# Patient Record
Sex: Female | Born: 1935 | Race: Black or African American | Hispanic: No | Marital: Single | Smoking: Never smoker
Health system: Southern US, Community
[De-identification: ages and names within clinical notes are randomized; demographics above are authoritative.]

## PROBLEM LIST (undated history)

## (undated) DIAGNOSIS — H269 Unspecified cataract: Secondary | ICD-10-CM

## (undated) DIAGNOSIS — I639 Cerebral infarction, unspecified: Secondary | ICD-10-CM

## (undated) DIAGNOSIS — N289 Disorder of kidney and ureter, unspecified: Secondary | ICD-10-CM

## (undated) DIAGNOSIS — I10 Essential (primary) hypertension: Secondary | ICD-10-CM

## (undated) DIAGNOSIS — R002 Palpitations: Secondary | ICD-10-CM

## (undated) HISTORY — PX: ABDOMINAL HYSTERECTOMY: SHX81

## (undated) HISTORY — DX: Disorder of kidney and ureter, unspecified: N28.9

## (undated) HISTORY — DX: Cerebral infarction, unspecified: I63.9

## (undated) HISTORY — DX: Palpitations: R00.2

## (undated) HISTORY — PX: TOTAL KNEE ARTHROPLASTY: SHX125

## (undated) HISTORY — DX: Essential (primary) hypertension: I10

---

## 2003-03-20 ENCOUNTER — Encounter: Admission: RE | Admit: 2003-03-20 | Discharge: 2003-03-20 | Payer: Self-pay | Admitting: Family Medicine

## 2003-03-20 ENCOUNTER — Encounter: Payer: Self-pay | Admitting: Family Medicine

## 2003-04-03 ENCOUNTER — Encounter: Payer: Self-pay | Admitting: Family Medicine

## 2003-04-03 ENCOUNTER — Encounter: Admission: RE | Admit: 2003-04-03 | Discharge: 2003-04-03 | Payer: Self-pay | Admitting: Family Medicine

## 2006-05-11 ENCOUNTER — Encounter: Admission: RE | Admit: 2006-05-11 | Discharge: 2006-05-11 | Payer: Self-pay | Admitting: Orthopedic Surgery

## 2007-04-16 ENCOUNTER — Encounter: Admission: RE | Admit: 2007-04-16 | Discharge: 2007-04-16 | Payer: Self-pay | Admitting: Orthopedic Surgery

## 2007-10-06 ENCOUNTER — Inpatient Hospital Stay (HOSPITAL_COMMUNITY): Admission: RE | Admit: 2007-10-06 | Discharge: 2007-10-11 | Payer: Self-pay | Admitting: Orthopedic Surgery

## 2010-10-22 NOTE — H&P (Signed)
[name], [name]               ACCOUNT NO.:  [id number]   MEDICAL RECORD NO.:  [id number]          PATIENT TYPE:  INP   LOCATION:  0003                         FACILITY:  [facility]   PHYSICIAN:  Patricia [name], M.D.    DATE OF BIRTH:  18-Aug-1918   DATE OF ADMISSION:  10/06/2007  DATE OF DISCHARGE:                              HISTORY & PHYSICAL   Date of office visit, history and physical performed on 08/26/2007.   CHIEF COMPLAINT:  Right knee pain.   HISTORY OF PRESENT ILLNESS:  The patient is an 77 year old female who  has been seen by Dr. Lequita [name] for ongoing right knee pain.  It has been  progressively getting worse.  She has gone undergone conservative  treatment of past but has end-stage arthritis now bone-on-bone.  It was  felt she would benefit from undergoing surgical intervention.  Risks,  benefits discussed.  She elects to proceed with surgery.  She has been  seen preoperatively by Dr. Clovis [name] and felt to be good for surgery.   ALLERGIES:  NO KNOWN DRUG ALLERGIES.   CURRENT MEDICATIONS:  Aspirin, Naprelan, amlodipine/benazepril.   PAST MEDICAL HISTORY:  Hypertension.   PAST SURGICAL HISTORY:  Hysterectomy, (please note she did have some  keloid scarring after hysterectomy).   SOCIAL HISTORY:  Single, nonsmoker.  No alcohol.  No children.  Family  will assist with care after surgery.   FAMILY HISTORY:  Heart disease, hypertension, diabetes, breast cancer,  stroke and arthritis.   REVIEW OF SYSTEMS:  GENERAL:  No fevers, chills, night sweats.  NEURO:  No seizures or paralysis.  RESPIRATORY: No shortness of breath,  productive cough or hemoptysis.  CARDIOVASCULAR:  No chest pain.  GI: No  nausea, vomiting, diarrhea or constipation.  GU: No dysuria or hematuria  or discharge.  MUSCULOSKELETAL:  Right knee.   PHYSICAL EXAMINATION:  VITAL SIGNS:  Pulse 64, respirations 14, blood  pressure 150/70.  GENERAL:  An 77 year old African American female well-nourished,  well-  developed, no acute distress.  She is alert and cooperative, accompanied  by her friends and family.  HEENT: Normocephalic, atraumatic.  Pupils  are reactive.  EOMs intact.  Upper partial plate.  Noted to wear  glasses.  NECK:  Supple.  No bruits.  CHEST: Clear anterior posterior chest walls.  No rhonchi, rales or  wheezes.  HEART:  Regular rate and rhythm.  No murmur.  ABDOMEN: Soft, nontender.  Bowel sounds present.  RECTUM/GENITALIA: Not done .  EXTREMITIES:  Right knee:  Significant valgus malalignment deformity.  No effusion.  Range of motion 5-150, marked crepitus, no instability.   IMPRESSION:  1. Osteoarthritis right knee.  2. Hypertension.   PLAN:  The patient admitted to [facility] to undergo a right  total knee replacement arthroplasty.  Surgery will be performed by Dr.  Ollen [name].      Patricia [name], P.A.C.      Patricia [name], M.D.  Electronically Signed    ALP/MEDQ  D:  10/06/2007  T:  10/06/2007  Job:  [id number]   cc:   Patricia [name], M.D.  Fax: [phone]   Patricia [name], M.D.  Fax: [phone]

## 2010-10-22 NOTE — Op Note (Signed)
NAMEDANELLY, HASSINGER               ACCOUNT NO.:  [id number]   MEDICAL RECORD NO.:  [id number]          PATIENT TYPE:  INP   LOCATION:  0003                         FACILITY:  [facility]   PHYSICIAN:  Ollen Gross, M.D.    DATE OF BIRTH:  [birth year]   DATE OF PROCEDURE:  10/06/2007  DATE OF DISCHARGE:                               OPERATIVE REPORT   PREOPERATIVE DIAGNOSIS:  Osteoarthritis right knee.   POSTOPERATIVE DIAGNOSIS:  Osteoarthritis right knee.   PROCEDURE:  Right total knee arthroplasty.   SURGEON:  Ollen Gross, M.D.   ASSISTANT:  Alexzandrew L. Perkins, P.A.C.   ANESTHESIA:  General with postop Marcaine pain pump.   ESTIMATED BLOOD LOSS:  Minimal.   DRAINS:  None.   TOURNIQUET TIME:  38 minutes 300 mmHg.   COMPLICATIONS:  None.   CONDITION:  Stable to recovery.   INDICATIONS:  Mrs. Gendron is an 77 year old female with end-stage  valgus arthritis of the right knee with progressively worsening painful  dysfunction.  She has failed non-operative management and presents for  total knee arthroplasty.   PROCEDURE IN DETAIL:  After successful administration of general  anesthetic, a tourniquet was placed on the right thigh and right lower  extremity was  prepped and draped in the usual sterile fashion.  Extremities wrapped in Esmarch, knee flexed and tourniquet inflated to  300 mmHg.  Midline incision was made a 10-blade through the subcutaneous  tissue to the level of the extensor mechanism.  A fresh blade is used  make a lateral parapatellar arthrotomy.  I went lateral because of the  valgus deformity.  Soft tissue of the proximal lateral tibia  subperiosteally elevated to the joint line and then around to the  posterolateral corner, but not including the structures of the  posterolateral corner.  The patella was everted medially, knee flexed 90  degrees, ACL, PCL removed.  Drill was used to create a starting hole in  the distal femur and now is thoroughly  irrigated.  The 5-degree right  valgus alignment guide is placed and referencing off posterior condyles,  rotation is marked with a block pinned to remove 11 mm off the distal  femur.  I took 11 because the lateral defect required _that I take 11 mm  in order to take some bone off laterally.  Distal femoral resection is  made with an oscillating saw.  Sizing blocks placed _and size 3 is most  appropriate.  Rotation marked off the epicondylar axis.  Size 3 cutting  block placed and the anterior-posterior chamfer cuts made.   Tibia subluxed forward and distal remnants removed.  Extramedullary  tibial alignment guide is placed proximally at the medial aspect of  tibial tubercle distally along the second metatarsal axis tibial crest.  The block was pinned to remove 2 mm off the more deficient lateral side.  Tibial resection made with an oscillating saw.  Size 3 is most  appropriate tibial component and the proximal tibia is prepared to  modular drill and keel punch for a size 3.  Femoral preparation is  completed with the intercondylar cut.  Size 3 mobile bearing tibial,  trial size 3 posterior stabilized femoral  trial and a 10 mm posterior stabilized rotating platform insert trial  are placed.  With the 10, full extension is achieved with excellent  varus, valgus, anterior, and posterior balance throughout the full range  of motion.  The patella was everted and measured 23 mm.  Freehand  resection was taken 14 meters, 38 template is placed, lug holes are  drilled, trial patella is placed, and it tracks normally.  Osteophytes  removed the posterior femur trial in place.  All trials removed and the  cut bone surfaces are prepared with pulsatile lavage.  Cement mixed and  once ready for implantation, size 3 mobile bearing tibial tray, size 3  posterior stabilized femur, 38 patella is cemented into place.  Patella  was held with a clamp.  Trial 10-mm inserts placed, knee held full  extension  and all extruded cement removed.  Cement fully hardened then  copiously irrigated with saline solution.  Trials removed and FloSeal  injected on the posterior capsule.  Permanent 10 mm posterior stabilized  rotating platform insert is placed into the tibial tray.  FloSeal was  injected into the mediolateral gutters and suprapatellar area.  Moist  sponge is placed and tourniquet released for total time of 38 minutes.  It was held for  2 minutes and removed.  Minimal bleeding was  encountered.  That which was found was stopped with electrocautery.  Wound was thoroughly irrigated saline solution and the lateral  arthrotomy closed with interrupted #1 PDS, leaving open the smaller of  the superior to inferior pole of patella.  This served  as a mini  release.  The flexion against gravity was 135 to 140 degrees.  Subcu was  closed with interrupted 2-0 Vicryl and subcuticular running 4-0  Monocryl.  The catheter for Marcaine pain pump was placed, the pump  initiated.  Steri-Strips and bulky sterile dressing applied and she is  placed in a knee immobilizer, awakened and transported to recovery in  stable condition.      Ollen Gross, M.D.  Electronically Signed     FA/MEDQ  D:  10/06/2007  T:  10/06/2007  Job:  [id number]

## 2010-10-25 NOTE — Discharge Summary (Signed)
NAMEMYRIAN, BOTELLO               ACCOUNT NO.:  [id number]   MEDICAL RECORD NO.:  [id number]          PATIENT TYPE:  INP   LOCATION:  1537                         [facility]ACILITY:  [facility]   PHYSICIAN:  Ollen Gross, M.D.    DATE OF BIRTH:  06-Jan-1919   DATE OF ADMISSION:  10/06/2007  DATE OF DISCHARGE:  10/11/2007                               DISCHARGE SUMMARY   ADMISSION DIAGNOSES:  1. Osteoarthritis right knee.  2. Hypertension.   DISCHARGE DIAGNOSES:  1. Osteoarthritis right knee, status post right total knee      arthroplasty.  2. Acute blood loss anemia.  3. Status post transfusion without sequelae.  4. Mild postop hypokalemia.  5. Hypertension.   PROCEDURE:  Right total knee, October 06, 2007.  Surgeon Dr. Lequita Halt,  assistant Avel Peace PA-C.   ANESTHESIA:  General.   CONSULTS:  None.   BRIEF HISTORY:  Ms. Heidel is an 77 year old female with end-stage  valgus arthritis of the right knee, progressive worsening pain and  dysfunction, who now presents for total knee.   LABORATORY DATA:  Preop CBC showed hemoglobin of 12.0, hematocrit 37.1  white cell count 6.4, platelets 209.  Hemoglobin dropped down to 8.3.  Given 2 units of blood.  Post receiving, hemoglobin back to 10.1 then  drifted back down to 9.6.  Last H&H 8.7 and 25.3.  PT/PTT on admission  30.5 and 32 respectively.  INR 1.0.  Serial ProTime followed.  Last  PT/INR 20.9 and 1.7.  Chem panel on admission showed elevated BUN of 30,  creatinine 1.36.  Remaining Chem panel within normal limits.  Serial  BMPs are followed. Creatinine did go up to 1.72, but came back down to  normal level of 1.0.  BUN started at 30, came down to 24 last noted,  normal level of 15.  Potassium started out at 4.5, went  down to 3.6,  last noted was just a minimally low level at 3.2.  Remainder of  electrolytes remained within normal limits.  Preop UA negative.  Follow-  up UA showed trace hemoglobin, only few epithelials, 0-3 white  cells,  rare bacteria, hyaline casts noted.  She did have urine culture grow out  Enterobacter, and this grew out after she was discharged from the  hospital.  Followed up on an outpatient basis.   EKG, September 30, 2007:  Marked sinus bradycardia, left ventricle  hypertrophy.  No previous EKGs.  Confirmed by Dr. Charlton Haws.  Chest X-  Ray, September 30, 2007:  No acute process.   HOSPITAL COURSE:  The patient admitted to [facility],  tolerated procedure well, later transferred to recovery room then  orthopedic floor, started on PCA and p.o. analgesic pain control  following surgery.  Given 24-hour postop IV antibiotics.  Did have fair  amount of pain on day one, but doing pretty well overall.  Hemoglobin  was down to 8.3.  It was felt she would need blood after the big  surgery, given 2 units of blood.  Blood pressure meds were resumed.   By day 2, hemoglobin was back up  to 10.1.  She had tolerated the  transfusion well, was feeling better.  Started getting up a little bit  more from bed to chair.  Slowly progressing with therapy.  Dressing  change incision looked good.  She did want to go home with family.  She  was not ready yet.  We did note that the creatinine was a little  elevated.  Continued fluids and rechecked the BMP which creatinine came  back down.  Slowly progressing with physical therapy over the next  couple of days.   By postop day #3, creatinine improved back down to 1.4.  Hemoglobin was  stable.  Walking about 100 feet by day 5.  Continued to slowly improve.  Her renal function was still mildly elevated but was slowly improving  with time.  The renal insufficiency was correcting itself by Oct 11, 2007.  The patient is doing much better, had progressed more with PT,  incision was healing well.  Arrangements being made for home, and she  was discharged later that day.   DISCHARGE/PLAN:  1. The patient discharged home on Oct 11, 2007.  2. Discharge diagnoses  please see above.  3. Discharge meds Vicodin, Robaxin, Nu-Iron, Coumadin.   DISCHARGE DIET:  Low-sodium heart-healthy diet.   ACTIVITY:  Weightbearing as tolerated.  Total knee protocol.  Home  health PT, home health nursing.  Follow-up on Tuesday 05/12 or Thursday  05/14 with Dr. Lequita Halt.  Call for appointment.   DISPOSITION:  Home.   CONDITION ON DISCHARGE:  Slowly improving.      Alexzandrew L. Perkins, P.A.C.      Ollen Gross, M.D.  Electronically Signed    ALP/MEDQ  D:  11/24/2007  T:  11/24/2007  Job:  [id number]

## 2011-03-04 LAB — CBC
Hemoglobin: 12
MCHC: 32.3
Platelets: 145 — ABNORMAL LOW
Platelets: 209
RDW: 12.6
RDW: 12.8
WBC: 7.3

## 2011-03-04 LAB — TYPE AND SCREEN
ABO/RH(D): AB POS
Antibody Screen: NEGATIVE

## 2011-03-04 LAB — PROTIME-INR
INR: 1
INR: 1.2
Prothrombin Time: 13.5
Prothrombin Time: 15.1

## 2011-03-04 LAB — COMPREHENSIVE METABOLIC PANEL
ALT: 20
Albumin: 3.9
Alkaline Phosphatase: 72
Calcium: 9.7
Glucose, Bld: 98
Potassium: 4.5
Sodium: 140
Total Protein: 7.7

## 2011-03-04 LAB — BASIC METABOLIC PANEL
BUN: 18
Calcium: 8.2 — ABNORMAL LOW
GFR calc non Af Amer: 41 — ABNORMAL LOW
Glucose, Bld: 159 — ABNORMAL HIGH

## 2011-03-04 LAB — URINALYSIS, ROUTINE W REFLEX MICROSCOPIC
Bilirubin Urine: NEGATIVE
Ketones, ur: NEGATIVE
Nitrite: NEGATIVE
Protein, ur: NEGATIVE
Urobilinogen, UA: 0.2
pH: 7

## 2011-03-04 LAB — ABO/RH: ABO/RH(D): AB POS

## 2013-03-08 ENCOUNTER — Emergency Department (HOSPITAL_COMMUNITY)
Admission: EM | Admit: 2013-03-08 | Discharge: 2013-03-08 | Disposition: A | Discharge status: Home / Self Care | Payer: Medicare Other | Attending: Emergency Medicine | Admitting: Emergency Medicine

## 2013-03-08 ENCOUNTER — Encounter (HOSPITAL_COMMUNITY): Payer: Self-pay | Admitting: Emergency Medicine

## 2013-03-08 DIAGNOSIS — R197 Diarrhea, unspecified: Secondary | ICD-10-CM | POA: Insufficient documentation

## 2013-03-08 DIAGNOSIS — Z7982 Long term (current) use of aspirin: Secondary | ICD-10-CM | POA: Insufficient documentation

## 2013-03-08 DIAGNOSIS — K6289 Other specified diseases of anus and rectum: Secondary | ICD-10-CM

## 2013-03-08 DIAGNOSIS — K644 Residual hemorrhoidal skin tags: Secondary | ICD-10-CM | POA: Insufficient documentation

## 2013-03-08 DIAGNOSIS — Z79899 Other long term (current) drug therapy: Secondary | ICD-10-CM | POA: Insufficient documentation

## 2013-03-08 LAB — BASIC METABOLIC PANEL
BUN: 25 mg/dL — ABNORMAL HIGH (ref 6–23)
CO2: 22 mEq/L (ref 19–32)
Calcium: 9.3 mg/dL (ref 8.4–10.5)
Chloride: 105 mEq/L (ref 96–112)
Creatinine, Ser: 1.31 mg/dL — ABNORMAL HIGH (ref 0.50–1.10)
GFR calc Af Amer: 40 mL/min — ABNORMAL LOW (ref >90)
GFR calc non Af Amer: 34 mL/min — ABNORMAL LOW (ref >90)
Glucose, Bld: 88 mg/dL (ref 70–99)
Potassium: 4 mEq/L (ref 3.5–5.1)
Sodium: 139 mEq/L (ref 135–145)

## 2013-03-08 LAB — CBC
HCT: 33 % — ABNORMAL LOW (ref 36.0–46.0)
Hemoglobin: 11 g/dL — ABNORMAL LOW (ref 12.0–15.0)
MCH: 31.7 pg (ref 26.0–34.0)
MCHC: 33.3 g/dL (ref 30.0–36.0)
MCV: 95.1 fL (ref 78.0–100.0)
Platelets: 189 10*3/uL (ref 150–400)
RBC: 3.47 MIL/uL — ABNORMAL LOW (ref 3.87–5.11)
RDW: 13 % (ref 11.5–15.5)
WBC: 8.6 10*3/uL (ref 4.0–10.5)

## 2013-03-08 MED ORDER — BELLADONNA-OPIUM 16.2-30 MG RE SUPP: 15.0000 mg | Freq: Two times a day (BID) | RECTAL | Status: DC

## 2013-03-08 NOTE — ED Notes (Signed)
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  I explained to pt that not all her blood work is back at this time, but if she feels she really needs to go home at this time I could get the PA to come speak with her.  Pt states that she will just wait for the lab results to come back and not worry about leaving at this time.

## 2013-03-08 NOTE — ED Notes (Signed)
Neighbor Jonny Ruiz [phone]

## 2013-03-08 NOTE — ED Provider Notes (Signed)
CSN: [id number]     Arrival date & time 03/08/13  1324 History   First MD Initiated Contact with Patient 03/08/13 1513     Chief Complaint  Patient presents with  . Rectal Pain   (Consider location/radiation/quality/duration/timing/severity/associated sxs/prior Treatment) HPI Patient presents to the emergency department with rectal pain that began 3 days, ago.  The patient, states she's also had some diarrhea.  Patient, states she has a history of hemorrhoids, but is not sure it is causing her pain.  Patient, states, that she's not taking anything for her symptoms.  Patient denies chest pain, shortness of breath, pain, nausea, vomiting, headache, blurred vision, weakness, numbness, dizziness, syncope, or fever.  The patient, states, that she did not see her primary care doctor about this issue. History reviewed. No pertinent past medical history. Past Surgical History  Procedure Laterality Date  . Total knee arthroplasty    . Abdominal hysterectomy     History reviewed. No pertinent family history. History  Substance Use Topics  . Smoking status: Never Smoker   . Smokeless tobacco: Never Used  . Alcohol Use: No   OB History   Grav Para Term Preterm Abortions TAB SAB Ect Mult Living                 Review of Systems All other systems negative except as documented in the HPI. All pertinent positives and negatives as reviewed in the HPI. Allergies  Review of patient's allergies indicates no known allergies.  Home Medications   Current Outpatient Rx  Name  Route  Sig  Dispense  Refill  . acetaminophen (TYLENOL) 325 MG tablet   Oral   Take 650 mg by mouth every 6 (six) hours as needed for pain or fever.         Marland Kitchen amLODipine-benazepril (LOTREL) 10-20 MG per capsule   Oral   Take 1 capsule by mouth daily.         Marland Kitchen aspirin 81 MG chewable tablet   Oral   Chew 81 mg by mouth daily.         . hydrochlorothiazide (MICROZIDE) 12.5 MG capsule   Oral   Take 6.25 mg by mouth  daily.         . metoprolol (LOPRESSOR) 50 MG tablet   Oral   Take 50 mg by mouth 2 (two) times daily.          BP 178/55  Pulse 57  Temp(Src) 98.2 F (36.8 C) (Oral)  Resp 16  SpO2 96% Physical Exam  Nursing note and vitals reviewed. Constitutional: She is oriented to person, place, and time. She appears well-developed and well-nourished. No distress.  Neck: Normal range of motion. Neck supple.  Cardiovascular: Normal rate, regular rhythm and normal heart sounds.  Exam reveals no gallop and no friction rub.   No murmur heard. Pulmonary/Chest: Effort normal and breath sounds normal.  Abdominal: Soft. Bowel sounds are normal. She exhibits no distension. There is no tenderness.  Genitourinary:  Patient external hemorrhoids, that are nontender upon palpation there is no signs of thrombosed external hemorrhoid  Neurological: She is alert and oriented to person, place, and time.  Skin: Skin is warm and dry. No rash noted.    ED Course  Procedures (including critical care time) Labs Review Labs Reviewed  CBC - Abnormal; Notable for the following:    RBC 3.47 (*)    Hemoglobin 11.0 (*)    HCT 33.0 (*)    All other components within  normal limits  BASIC METABOLIC PANEL - Abnormal; Notable for the following:    BUN 25 (*)    Creatinine, Ser 1.31 (*)    GFR calc non Af Amer 34 (*)    GFR calc Af Amer 40 (*)    All other components within normal limits   patient be referred back to her primary care Dr. test here today did not show any acute abnormalities.  Patient's vital signs been stable.  Patient will be treated for her symptoms  MDM      Carlyle Dolly, PA-C 03/08/13 1838

## 2013-03-08 NOTE — ED Notes (Signed)
Pt has a hx of hemroids and states tat she is having pain from them with some bleeding. No n/v

## 2013-03-08 NOTE — ED Provider Notes (Signed)
  This was a shared visit with a mid-level provided (NP or PA).  Throughout the patient's course I was available for consultation/collaboration.  I saw the ECG (if appropriate), relevant labs and studies - I agree with the interpretation.  On my exam the patient was in no distress.  She appears generally well.  We discussed her pain at length.      Gerhard Munch, MD 03/08/13 [phone]

## 2013-03-28 ENCOUNTER — Ambulatory Visit: Payer: Self-pay | Admitting: Podiatry

## 2013-11-14 ENCOUNTER — Encounter: Payer: Self-pay | Admitting: *Deleted

## 2013-11-14 ENCOUNTER — Emergency Department (INDEPENDENT_AMBULATORY_CARE_PROVIDER_SITE_OTHER)
Admission: EM | Admit: 2013-11-14 | Discharge: 2013-11-14 | Disposition: A | Discharge status: Home / Self Care | Payer: Medicare Other | Source: Home / Self Care | Attending: Emergency Medicine | Admitting: Emergency Medicine

## 2013-11-14 ENCOUNTER — Encounter (HOSPITAL_COMMUNITY): Payer: Self-pay | Admitting: Emergency Medicine

## 2013-11-14 DIAGNOSIS — R002 Palpitations: Secondary | ICD-10-CM

## 2013-11-14 LAB — POCT I-STAT, CHEM 8
BUN: 31 mg/dL — ABNORMAL HIGH (ref 6–23)
CALCIUM ION: 1.3 mmol/L (ref 1.13–1.30)
CREATININE: 1.3 mg/dL — AB (ref 0.50–1.10)
Chloride: 107 mEq/L (ref 96–112)
GLUCOSE: 96 mg/dL (ref 70–99)
HEMATOCRIT: 36 % (ref 36.0–46.0)
HEMOGLOBIN: 12.2 g/dL (ref 12.0–15.0)
Potassium: 4.8 mEq/L (ref 3.7–5.3)
Sodium: 143 mEq/L (ref 137–147)
TCO2: 24 mmol/L (ref 0–100)

## 2013-11-14 NOTE — ED Notes (Signed)
C/o tachycardia for a couple of days States she spoke with PCP and was told to go to ER  Denies any pain

## 2013-11-14 NOTE — Discharge Instructions (Signed)
Palpitations  A palpitation is the feeling that your heartbeat is irregular. It may feel like your heart is fluttering or skipping a beat. It may also feel like your heart is beating faster than normal. This is usually not a serious problem. In some cases, you may need more medical tests. HOME CARE  Avoid:  Caffeine in coffee, tea, soft drinks, diet pills, and energy drinks.  Chocolate.  Alcohol.  Stop smoking if you smoke.  Reduce your stress and anxiety. Try:  A method that measures bodily functions so you can learn to control them (biofeedback).  Yoga.  Meditation.  Physical activity such as swimming, jogging, or walking.  Get plenty of rest and sleep. GET HELP RIGHT AWAY IF:   You have chest pain.  You feel short of breath.  You have a very bad headache.  You feel dizzy or pass out (faint).  Your fast or irregular heartbeat continues after 24 hours.  Your palpitations occur more often. MAKE SURE YOU:   Understand these instructions.  Will watch your condition.  Will get help right away if you are not doing well or get worse. Document Released: 03/04/2008 Document Revised: 11/25/2011 Document Reviewed: 07/25/2011 [employer] Patient Information 2014 [employer].  

## 2013-11-14 NOTE — ED Provider Notes (Signed)
Chief Complaint   Chief Complaint  Patient presents with  . Tachycardia    History of Present Illness   Patricia [name] is a 80 year old female who for the past week states she's had palpitations every night when she lies down to go to sleep. She describes this as a forceful, somewhat rapid heartbeat. She went to see her primary care Dr. about this, Dr. Lupe Carneyean Mitchell a week ago and he did an EKG which was normal. He did not make any changes in her medication. She's had continuing symptoms since then. She denies any associated chest pain, tightness, pressure, dizziness, syncope, presyncope, or shortness of breath. This only happens when she is lying down at nighttime and never happens during the daytime when she is up and about.  Review of Systems   Other than noted above, the patient denies any of the following symptoms. Pulmonary:  No cough, wheezing, shortness of breath. Cardiac:  No chest pain, tightness, pressure, dizziness, presyncope, syncope, PND, orthopnea, or edema. Psych:  No anxiety or depression. Endo:  No weight loss, tremor, sweats, or heat intolerance.  PMFSH   Past medical history, family history, social history, meds, and allergies were reviewed.  She has high blood pressure and takes amlodipine/benazepril 10/20, a baby aspirin a day, and metoprolol 50 mg per day.  Physical Examination   Vital signs:  BP 183/66  Pulse 54  Temp(Src) 98.4 F (36.9 C) (Oral)  Resp 16  SpO2 98% Gen:  Alert, oriented, in no distress, skin warm and dry. Eye:  PERRL, lids and conjunctivas normal.  No stare or lid lag. ENT:  Mucous membranes moist, pharynx clear. Neck:  Supple, no adenopathy or tenderness.  No JVD.  Thyroid not enlarged. Lungs:  Clear to auscultation, no wheezes, rales or rhonchi.  No respiratory distress. Heart:  Regular rhythm, no extrasystoles.  No gallops, murmers, clicks or rubs. Abdomen:  Soft, nontender, no organomegaly or mass.  Bowel sounds normal.  No pulsatile  abdominal mass or bruit. Ext:  No edema. Pulses full and equal. Skin:  Warm and dry.  No rash.  Labs   Results for orders placed during the hospital encounter of 11/14/13  POCT I-STAT, CHEM 8      Result Value Ref Range   Sodium 143  137 - 147 mEq/L   Potassium 4.8  3.7 - 5.3 mEq/L   Chloride 107  96 - 112 mEq/L   BUN 31 (*) 6 - 23 mg/dL   Creatinine, Ser 9.601.30 (*) 0.50 - 1.10 mg/dL   Glucose, Bld 96  70 - 99 mg/dL   Calcium, Ion 4.541.30  0.981.13 - 1.30 mmol/L   TCO2 24  0 - 100 mmol/L   Hemoglobin 12.2  12.0 - 15.0 g/dL   HCT 11.936.0  14.736.0 - 82.946.0 %     EKG Results:  Date: 11/14/2013  Rate: 52  Rhythm: sinus bradycardia  QRS Axis: normal  Intervals: normal  ST/T Wave abnormalities: normal  Conduction Disutrbances:none  Narrative Interpretation: Sinus bradycardia, otherwise normal EKG.  Old EKG Reviewed: none available   Assessment   The encounter diagnosis was Palpitations.  I think she needs a 24 Holter monitor or event monitor to further characterize the nature of these palpitations. I am reluctant to increase her beta blockers since her heart rate is already slow. I suggested she followup with cardiology. She does not appear to have a dangerous or life threatening problem.  Plan    1.  Meds:  The following meds  were prescribed:   Discharge Medication List as of 11/14/2013  3:05 PM      2.  Patient Education/Counseling:  The patient was given appropriate handouts, self care instructions, and instructed in symptomatic relief.    3.  Follow up:  The patient was told to follow up here if no better in 3 to 4 days, or sooner if becoming worse in any way, and given some red flag symptoms such as syncope, presyncope, chest pain or dyspnea which would prompt immediate return.         Reuben Likes, MD 11/14/13 [phone]

## 2013-11-15 ENCOUNTER — Encounter: Payer: Self-pay | Admitting: Cardiology

## 2013-11-15 ENCOUNTER — Ambulatory Visit (INDEPENDENT_AMBULATORY_CARE_PROVIDER_SITE_OTHER): Payer: Medicare Other | Admitting: Cardiology

## 2013-11-15 VITALS — BP 180/72 | HR 52 | Ht 64.0 in | Wt 142.2 lb

## 2013-11-15 DIAGNOSIS — R002 Palpitations: Secondary | ICD-10-CM

## 2013-11-15 DIAGNOSIS — I10 Essential (primary) hypertension: Secondary | ICD-10-CM

## 2013-11-15 DIAGNOSIS — R0989 Other specified symptoms and signs involving the circulatory and respiratory systems: Secondary | ICD-10-CM

## 2013-11-15 NOTE — Patient Instructions (Signed)
Your physician has requested that you have a carotid duplex. This test is an ultrasound of the carotid arteries in your neck. It looks at blood flow through these arteries that supply the brain with blood. Allow one hour for this exam. There are no restrictions or special instructions.  Your physician has recommended that you wear a 48 hour holter monitor. Holter monitors are medical devices that record the heart's electrical activity. Doctors most often use these monitors to diagnose arrhythmias. Arrhythmias are problems with the speed or rhythm of the heartbeat. The monitor is a small, portable device. You can wear one while you do your normal daily activities. This is usually used to diagnose what is causing palpitations/syncope (passing out).  Your physician recommends that you schedule a follow-up appointment in: 1 MONTH with Dr Antoine Poche

## 2013-11-15 NOTE — Progress Notes (Signed)
HPI The patient presents as a new patient for evaluation of palpitations.  She was in the ED yesterday with these.  They have been happening for two weeks most nights.  She describes a rapid beat that might last for 15 minutes at a time.  It seems to speed up and slow down.  She does not report that it is irregular.  She tries to change position but this doesn't help.  She does not have this during the day.  She has had no syncope.  The patient denies any new symptoms such as chest discomfort, neck or arm discomfort. There has been no new shortness of breath, PND or orthopnea.   No Known Allergies  Current Outpatient Prescriptions  Medication Sig Dispense Refill  . acetaminophen (TYLENOL) 325 MG tablet Take 650 mg by mouth every 6 (six) hours as needed for pain or fever.      Marland Kitchen amLODipine-benazepril (LOTREL) 10-20 MG per capsule Take 1 capsule by mouth daily.      Marland Kitchen aspirin 81 MG chewable tablet Chew 81 mg by mouth daily.      . metoprolol (LOPRESSOR) 50 MG tablet Take 50 mg by mouth 2 (two) times daily.       No current facility-administered medications for this visit.    No past medical history on file.  Past Surgical History  Procedure Laterality Date  . Total knee arthroplasty    . Abdominal hysterectomy      No family history on file.  History   Social History  . Marital Status: Single    Spouse Name: N/A    Number of Children: N/A  . Years of Education: N/A   Occupational History  . Not on file.   Social History Main Topics  . Smoking status: Never Smoker   . Smokeless tobacco: Never Used  . Alcohol Use: No  . Drug Use: No  . Sexual Activity: Not on file   Other Topics Concern  . Not on file   Social History Narrative  . No narrative on file    ROS:  Right knee pain.  Otherwise as stated in the HPI and negative for all other systems.  PHYSICAL EXAM BP 180/72  Pulse 52  Ht 5\' 4"  (1.626 m)  Wt 142 lb 3.2 oz (64.501 kg)  BMI 24.40 kg/m2 GENERAL:  Well  appearing HEENT:  Pupils equal round and reactive, fundi not visualized, oral mucosa unremarkable NECK:  No jugular venous distention, waveform within normal limits, carotid upstroke brisk and symmetric, left bruits, no thyromegaly LYMPHATICS:  No cervical, inguinal adenopathy LUNGS:  Clear to auscultation bilaterally BACK:  No CVA tenderness CHEST:  Unremarkable HEART:  PMI not displaced or sustained,S1 and S2 within normal limits, no S3, no S4, no clicks, no rubs, no murmurs ABD:  Flat, positive bowel sounds normal in frequency in pitch, no bruits, no rebound, no guarding, no midline pulsatile mass, no hepatomegaly, no splenomegaly EXT:  2 plus pulses upper pulses, mild decreased DP/PT bilateral, mild ankle edema, no cyanosis no clubbing SKIN:  No rashes no nodules NEURO:  Cranial nerves II through XII grossly intact, motor grossly intact throughout PSYCH:  Cognitively intact, oriented to person place and time  EKG:  Sinus rhythm, rate 52, axis within normal limits, intervals within normal limits, no acute ST-T wave changes.  11/15/2013  ASSESSMENT AND PLAN  PALPITATIONS:   I will apply a 48 hour Holter.  She did mention stress as a possibility.  Further treatment  will be based on this   HTN: The blood pressure continues to be high. I have instructed the patient to record a blood pressure diary and recording this. This will be presented for my review and pending these results I will make further suggestions about changes in therapy for optimal blood pressure control.  CKD:   Pending her BP readings I might consider renal Doppler.   BRUIT:   I will obtain carotid Dopplers.

## 2013-11-23 ENCOUNTER — Ambulatory Visit (HOSPITAL_COMMUNITY): Payer: Medicare Other | Attending: Cardiology | Admitting: *Deleted

## 2013-11-23 ENCOUNTER — Encounter (INDEPENDENT_AMBULATORY_CARE_PROVIDER_SITE_OTHER): Payer: Medicare Other

## 2013-11-23 DIAGNOSIS — I10 Essential (primary) hypertension: Secondary | ICD-10-CM | POA: Diagnosis not present

## 2013-11-23 DIAGNOSIS — I658 Occlusion and stenosis of other precerebral arteries: Secondary | ICD-10-CM | POA: Insufficient documentation

## 2013-11-23 DIAGNOSIS — I6529 Occlusion and stenosis of unspecified carotid artery: Secondary | ICD-10-CM | POA: Diagnosis not present

## 2013-11-23 DIAGNOSIS — R0989 Other specified symptoms and signs involving the circulatory and respiratory systems: Secondary | ICD-10-CM | POA: Diagnosis present

## 2013-11-23 DIAGNOSIS — R002 Palpitations: Secondary | ICD-10-CM

## 2013-11-23 NOTE — Progress Notes (Signed)
Carotid duplex complete 

## 2013-12-27 ENCOUNTER — Ambulatory Visit: Payer: Medicare Other | Admitting: Cardiology

## 2014-01-05 ENCOUNTER — Ambulatory Visit: Payer: Medicare Other | Admitting: Cardiology

## 2014-01-09 ENCOUNTER — Ambulatory Visit (INDEPENDENT_AMBULATORY_CARE_PROVIDER_SITE_OTHER): Payer: Medicare Other | Admitting: Physician Assistant

## 2014-01-09 ENCOUNTER — Encounter: Payer: Self-pay | Admitting: Physician Assistant

## 2014-01-09 VITALS — BP 184/72 | HR 53 | Ht 67.0 in | Wt 150.3 lb

## 2014-01-09 DIAGNOSIS — I10 Essential (primary) hypertension: Secondary | ICD-10-CM

## 2014-01-09 NOTE — Patient Instructions (Addendum)
Lab work today bmet,free [employer],tsh  [facility]  First floor   Check Blood Pressure daily and keep a diary. Call office in 1 week and report Blood Pressure Readings   [phone]  Your physician recommends that you schedule a follow-up with Dr.Hochrein in 1 month

## 2014-01-09 NOTE — Progress Notes (Signed)
[address], [address] [place], [place] [address] Phone: [phone]  Date:  01/09/2014   Patient ID:  Patricia [name], Patricia MeoDOB 12/20/1918, MRN [id number]   PCP:  Lupe CarneyMITCHELL,DEAN, MD  Cardiologist:  Dr. Antoine PocheHochrein   History of Present Illness: Patricia [name] is a 80 y.o. female with history of HTN and recent 48-hr Holter for palpitations showing some atrial ectopy. Report states she had 1208 SVE beats comprising 0.82% of total, 1 atrial run lasting 1.25 seconds, lowest HR 40bpm, wandering pacemaker, but no corresponding symptoms by diary. She has been feeling much better since last OV. The palpitations have subsided. She remains on metoprolol. She denies CP, SOB, nausea, syncope, headache, or any other symptoms. BP is elevated in clinic today. She has not yet taken her amlodipine/benazepril - usually takes around 3pm. She was supposed to keep a log of her blood pressures but has not done this yet. She states her niece takes it occasionally and always reports that it's "normal" at home but she does not know the numbers. The patient has chronic vision impairment so taking her own blood pressure is difficult for her. She follows a low salt diet and overall believes she is doing very well for her age.  Recent Labs: 11/14/2013: Creatinine 1.30*; Hemoglobin 12.2; Potassium 4.8   Wt Readings from Last 3 Encounters:  01/09/14 150 lb 4.8 oz (68.176 kg)  11/15/13 142 lb 3.2 oz (64.501 kg)     Past Medical History  Diagnosis Date  . HTN (hypertension)   . Renal insufficiency     a. Based on labs available in Epic.  Marland Kitchen. Palpitations     a. Event monitor 11/2013: rare atrial ectopy, brief atrial runs (longest 1.25seconds), SVE complexes represent 0.82% of total beats, no atrial fibirillation seen, wandering pacemaker, slowest HR 40bpm.    Current Outpatient Prescriptions  Medication Sig Dispense Refill  . acetaminophen (TYLENOL) 325 MG tablet Take 650 mg by mouth every 6 (six) hours as needed for pain or  fever.      Marland Kitchen. amLODipine-benazepril (LOTREL) 10-20 MG per capsule Take 1 capsule by mouth daily.      Marland Kitchen. aspirin 81 MG chewable tablet Chew 81 mg by mouth daily.      . metoprolol (LOPRESSOR) 50 MG tablet Take 50 mg by mouth 2 (two) times daily.       No current facility-administered medications for this visit.    Allergies:   Review of patient's allergies indicates no known allergies.   Social History:  The patient  reports that she has never smoked. She has never used smokeless tobacco. She reports that she does not drink alcohol or use illicit drugs.   Family History:  The patient's family history includes Diabetes in her brother, father, and sister.   ROS:  Please see the history of present illness.   All other systems reviewed and negative.   PHYSICAL EXAM: VS:  BP 184/72  Pulse 53  Ht 5\' 7"  (1.702 m)  Wt 150 lb 4.8 oz (68.176 kg)  BMI 23.53 kg/m2 Well nourished, well developed elderly AAF, in no acute distress - fairly mobile given her advanced age, walks with a cane HEENT: normal Neck: no JVD Cardiac:  normal S1, S2; RRR; no murmur Lungs:  clear to auscultation bilaterally, no wheezing, rhonchi or rales Abd: soft, nontender, no hepatomegaly Ext: no edema Skin: warm and dry Neuro:  moves all extremities spontaneously, no focal abnormalities noted  ASSESSMENT AND PLAN:  1. PACs - palpitations have  subsided. Continue BB and monitor for recurrent symptoms. 2. Hypertension, elevated - have asked her to involve her family/friends to help her check her blood pressure daily over the next week and call us with her results. If BP is elevated, the next best step might be hydralazine 25mg  TID. She has renal insufficiency so increasing ACEI may not be the best option, and baseline HR is too low for BB titration. She is already on max amlodipine. Will also send thyroid function and BMET today as updated baseline. I will hold off on previously mentioned renal duplex pending what OP BP's are  doing.  Dispo: F/u Dr. Antoine Poche or PA/NP in 1 month. At that time if she is doing well, would probably defer further monitoring to her PCP.  Signed, Ronie Spies, PA-C  01/09/2014 5:20 PM

## 2014-01-10 ENCOUNTER — Telehealth: Payer: Self-pay | Admitting: *Deleted

## 2014-01-10 LAB — T4, FREE: Free T4: 1.24 ng/dL (ref 0.80–1.80)

## 2014-01-10 LAB — BASIC METABOLIC PANEL
BUN: 33 mg/dL — AB (ref 6–23)
CALCIUM: 9.8 mg/dL (ref 8.4–10.5)
CO2: 26 meq/L (ref 19–32)
CREATININE: 1.22 mg/dL — AB (ref 0.50–1.10)
Chloride: 102 mEq/L (ref 96–112)
Glucose, Bld: 95 mg/dL (ref 70–99)
Potassium: 5 mEq/L (ref 3.5–5.3)
Sodium: 138 mEq/L (ref 135–145)

## 2014-01-10 LAB — TSH: TSH: 2.1 u[IU]/mL (ref 0.350–4.500)

## 2014-01-10 NOTE — Telephone Encounter (Signed)
pt notified about lab results with verbal understanding  

## 2014-02-15 ENCOUNTER — Ambulatory Visit: Payer: Medicare Other | Admitting: Cardiology

## 2014-03-01 ENCOUNTER — Ambulatory Visit (INDEPENDENT_AMBULATORY_CARE_PROVIDER_SITE_OTHER): Payer: Medicare Other | Admitting: Cardiology

## 2014-03-01 ENCOUNTER — Encounter: Payer: Self-pay | Admitting: Cardiology

## 2014-03-01 VITALS — BP 166/94 | HR 48 | Ht 66.0 in | Wt 151.8 lb

## 2014-03-01 DIAGNOSIS — R002 Palpitations: Secondary | ICD-10-CM

## 2014-03-01 MED ORDER — METOPROLOL TARTRATE 25 MG PO TABS: 25.0000 mg | ORAL_TABLET | Freq: Two times a day (BID) | ORAL | Status: DC

## 2014-03-01 NOTE — Progress Notes (Signed)
   HPI The patient presents for evaluation of palpitations.   After the last visit she did wear a monitor which showed atrial ectopy with brief atrial runs, SV complexes, wandering atrial pacemaker but no clear atrial fibrillation.  She has been treated with beta blockers and was actually doing better.  She denies any palpitations.  She has no presyncope or syncope.    No Known Allergies  Current Outpatient Prescriptions  Medication Sig Dispense Refill  . acetaminophen (TYLENOL) 325 MG tablet Take 650 mg by mouth every 6 (six) hours as needed for pain or fever.      Marland Kitchen amLODipine-benazepril (LOTREL) 10-20 MG per capsule Take 1 capsule by mouth daily.      Marland Kitchen aspirin 81 MG chewable tablet Chew 81 mg by mouth daily.      . metoprolol (LOPRESSOR) 50 MG tablet Take 50 mg by mouth 2 (two) times daily.       No current facility-administered medications for this visit.    Past Medical History  Diagnosis Date  . HTN (hypertension)   . Renal insufficiency     a. Based on labs available in Epic.  Marland Kitchen Palpitations     a. Event monitor 11/2013: rare atrial ectopy, brief atrial runs (longest 1.25seconds), SVE complexes represent 0.82% of total beats, no atrial fibirillation seen, wandering pacemaker, slowest HR 40bpm.    Past Surgical History  Procedure Laterality Date  . Total knee arthroplasty      right  . Abdominal hysterectomy      ROS:  As stated in the HPI and negative for all other systems.  PHYSICAL EXAM BP 166/94  Pulse 48  Ht  (1.676 m)  Wt 151 lb 12.8 oz (68.856 kg)  BMI 24.51 kg/m2 GENERAL:  Well appearing NECK:  No jugular venous distention, waveform within normal limits, carotid upstroke brisk and symmetric, left bruits, no thyromegaly LUNGS:  Clear to auscultation bilaterally CHEST:  Unremarkable HEART:  PMI not displaced or sustained,S1 and S2 within normal limits, no S3, no S4, no clicks, no rubs, no murmurs ABD:  Flat, positive bowel sounds normal in frequency in  pitch, no bruits, no rebound, no guarding, no midline pulsatile mass, no hepatomegaly, no splenomegaly EXT:  2 plus upper pulses, mild decreased DP/PT bilateral, mild ankle edema, no cyanosis no clubbing   EKG:  Sinus rhythm, rate 48, axis within normal limits, intervals within normal limits, no acute ST-T wave changes.  03/01/2014  ASSESSMENT AND PLAN  PALPITATIONS:   She is no longer feeling palpitations. I will reduce her beta blocker as her resting heart rate is running in the 40s. She can come back as needed.  HTN:   She reported previously that her BP is controlled at home and that it goes up in the MD office.  I will not further adjust her meds.    CKD:   She had mild renal insufficiency when last checked in August. This can be followed by Lupe Carney, MD  BRUIT:   She had less than 40% blockage last year. We will put her in for recall for carotid Doppler next year.

## 2014-03-01 NOTE — Patient Instructions (Signed)
Your physician recommends that you schedule a follow-up appointment in: as needed  We are ordering a corotid doppler  We are decreasing your Metoprolol to 25 mg two times a day

## 2014-05-17 ENCOUNTER — Other Ambulatory Visit (HOSPITAL_COMMUNITY): Payer: Self-pay | Admitting: Cardiology

## 2014-05-17 DIAGNOSIS — I6523 Occlusion and stenosis of bilateral carotid arteries: Secondary | ICD-10-CM

## 2014-05-23 ENCOUNTER — Ambulatory Visit (HOSPITAL_COMMUNITY): Payer: Medicare Other | Attending: Family Medicine | Admitting: Cardiology

## 2014-05-23 DIAGNOSIS — I6523 Occlusion and stenosis of bilateral carotid arteries: Secondary | ICD-10-CM | POA: Diagnosis not present

## 2014-05-23 NOTE — Progress Notes (Signed)
Carotid duplex performed 

## 2014-10-28 ENCOUNTER — Emergency Department (HOSPITAL_COMMUNITY)
Admission: EM | Admit: 2014-10-28 | Discharge: 2014-10-28 | Disposition: A | Discharge status: Home / Self Care | Payer: Medicare Other | Attending: Emergency Medicine | Admitting: Emergency Medicine

## 2014-10-28 ENCOUNTER — Encounter (HOSPITAL_COMMUNITY): Payer: Self-pay | Admitting: Emergency Medicine

## 2014-10-28 DIAGNOSIS — Z79899 Other long term (current) drug therapy: Secondary | ICD-10-CM | POA: Diagnosis not present

## 2014-10-28 DIAGNOSIS — Z87448 Personal history of other diseases of urinary system: Secondary | ICD-10-CM | POA: Insufficient documentation

## 2014-10-28 DIAGNOSIS — K6289 Other specified diseases of anus and rectum: Secondary | ICD-10-CM | POA: Insufficient documentation

## 2014-10-28 DIAGNOSIS — Z90710 Acquired absence of both cervix and uterus: Secondary | ICD-10-CM | POA: Insufficient documentation

## 2014-10-28 DIAGNOSIS — K5900 Constipation, unspecified: Secondary | ICD-10-CM | POA: Diagnosis not present

## 2014-10-28 DIAGNOSIS — I10 Essential (primary) hypertension: Secondary | ICD-10-CM | POA: Insufficient documentation

## 2014-10-28 MED ORDER — POLYETHYLENE GLYCOL 3350 17 GM/SCOOP PO POWD: 17.0000 g | Freq: Two times a day (BID) | ORAL | Status: DC

## 2014-10-28 MED ORDER — PRAMOXINE HCL 1 % RE FOAM: 1.0000 "application " | Freq: Three times a day (TID) | RECTAL | Status: DC | PRN

## 2014-10-28 MED ORDER — MILK AND MOLASSES ENEMA
1.0000 | Freq: Once | RECTAL | Status: AC
  Administered 2014-10-28: 250 mL via RECTAL
  Filled 2014-10-28: qty 250

## 2014-10-28 NOTE — ED Notes (Signed)
Pt continues to have urge to BM; large amount formed/solid stool noted in bedside commode.

## 2014-10-28 NOTE — Discharge Instructions (Signed)
Constipation °Constipation is when a person has fewer than three bowel movements a week, has difficulty having a bowel movement, or has stools that are dry, hard, or larger than normal. As people grow older, constipation is more common. If you try to fix constipation with medicines that make you have a bowel movement (laxatives), the problem may get worse. Long-term laxative use may cause the muscles of the colon to become weak. A low-fiber diet, not taking in enough fluids, and taking certain medicines may make constipation worse.  °CAUSES  °· Certain medicines, such as antidepressants, pain medicine, iron supplements, antacids, and water pills.   °· Certain diseases, such as diabetes, irritable bowel syndrome (IBS), thyroid disease, or depression.   °· Not drinking enough water.   °· Not eating enough fiber-rich foods.   °· Stress or travel.   °· Lack of physical activity or exercise.   °· Ignoring the urge to have a bowel movement.   °· Using laxatives too much.   °SIGNS AND SYMPTOMS  °· Having fewer than three bowel movements a week.   °· Straining to have a bowel movement.   °· Having stools that are hard, dry, or larger than normal.   °· Feeling full or bloated.   °· Pain in the lower abdomen.   °· Not feeling relief after having a bowel movement.   °DIAGNOSIS  °Your health care provider will take a medical history and perform a physical exam. Further testing may be done for severe constipation. Some tests may include: °· A barium enema X-ray to examine your rectum, colon, and, sometimes, your small intestine.   °· A sigmoidoscopy to examine your lower colon.   °· A colonoscopy to examine your entire colon. °TREATMENT  °Treatment will depend on the severity of your constipation and what is causing it. Some dietary treatments include drinking more fluids and eating more fiber-rich foods. Lifestyle treatments may include regular exercise. If these diet and lifestyle recommendations do not help, your health care  provider may recommend taking over-the-counter laxative medicines to help you have bowel movements. Prescription medicines may be prescribed if over-the-counter medicines do not work.  °HOME CARE INSTRUCTIONS  °· Eat foods that have a lot of fiber, such as fruits, vegetables, whole grains, and beans. °· Limit foods high in fat and processed sugars, such as french fries, hamburgers, cookies, candies, and soda.   °· A fiber supplement may be added to your diet if you cannot get enough fiber from foods.   °· Drink enough fluids to keep your urine clear or pale yellow.   °· Exercise regularly or as directed by your health care provider.   °· Go to the restroom when you have the urge to go. Do not hold it.   °· Only take over-the-counter or prescription medicines as directed by your health care provider. Do not take other medicines for constipation without talking to your health care provider first.   °SEEK IMMEDIATE MEDICAL CARE IF:  °· You have bright red blood in your stool.   °· Your constipation lasts for more than 4 days or gets worse.   °· You have abdominal or rectal pain.   °· You have thin, pencil-like stools.   °· You have unexplained weight loss. °MAKE SURE YOU:  °· Understand these instructions. °· Will watch your condition. °· Will get help right away if you are not doing well or get worse. °Document Released: 02/22/2004 Document Revised: 05/31/2013 Document Reviewed: 03/07/2013 °[employer]® Patient Information ©2015 [employer]. This information is not intended to replace advice given to you by your health care provider. Make sure you discuss any questions   you have with your health care provider. ° °Fecal Impaction °A fecal impaction happens when there is a large, firm amount of stool (or feces) that cannot be passed. The impacted stool is usually in the rectum, which is the lowest part of the large bowel. The impacted stool can block the colon and cause significant problems. °CAUSES  °The longer stool  stays in the rectum, the harder it gets. Anything that slows down your bowel movements can lead to fecal impaction, such as: °· Constipation. This can be a long-standing (chronic) problem or can happen suddenly (acute). °· Painful conditions of the rectum, such as hemorrhoids or anal fissures. The pain of these conditions can make you try to avoid having bowel movements. °· Narcotic pain-relieving medicines, such as methadone, morphine, or codeine. °· Not drinking enough fluids. °· Inactivity and bed rest over long periods of time. °· Diseases of the brain or nervous system that damage the nerves controlling the muscles of the intestines. °SIGNS AND SYMPTOMS  °· Lack of normal bowel movements or changes in bowel patterns. °· Sense of fullness in the rectum but unable to pass stool. °· Pain or cramps in the abdominal area (often after meals). °· Thin, watery discharge from the rectum. °DIAGNOSIS  °Your health care provider may suspect that you have a fecal impaction based on your symptoms and a physical exam. This will include an exam of your rectum. Sometimes X-rays or lab testing may be needed to confirm the diagnosis and to be sure there are no other problems.  °TREATMENT  °· Initially an impaction can be removed manually. Using a gloved finger, your health care provider can remove hard stool from your rectum. °· Medicine is sometimes needed. A suppository or enema can be given in the rectum to soften the stool, which can stimulate a bowel movement. Medicines can also be given by mouth (orally). °· Though rare, surgery may be needed if the colon has torn (perforated) due to blockage. °HOME CARE INSTRUCTIONS  °· Develop regular bowel habits. This could include getting in the habit of having a bowel movement after your morning cup of coffee or after eating. Be sure to allow yourself enough time on the toilet. °· Maintain a high-fiber diet. °· Drink enough fluids to keep your urine clear or pale yellow as directed by  your health care provider. °· Exercise regularly. °· If you begin to get constipated, increase the amount of fiber in your diet. Eat plenty of fruits, vegetables, whole wheat breads, bran, oatmeal, and similar products. °· Take natural fiber laxatives or other laxatives only as directed by your health care provider. °SEEK MEDICAL CARE IF:  °· You have ongoing rectal pain. °· You require enemas or suppositories more than twice a week. °· You have rectal bleeding. °· You have continued problems, or you develop abdominal pain. °· You have thin, pencil-like stools. °SEEK IMMEDIATE MEDICAL CARE IF:  °You have black or tarry stools. °MAKE SURE YOU:  °· Understand these instructions. °· Will watch your condition. °· Will get help right away if you are not doing well or get worse. °Document Released: 02/16/2004 Document Revised: 03/16/2013 Document Reviewed: 11/30/2012 °[employer]® Patient Information ©2015 [employer]. This information is not intended to replace advice given to you by your health care provider. Make sure you discuss any questions you have with your health care provider. ° °

## 2014-10-28 NOTE — ED Notes (Signed)
Pt continuing to have BM. Pt states that her stomach is cramping but she feels much better.

## 2014-10-28 NOTE — ED Provider Notes (Signed)
Medical screening examination/treatment/procedure(s) were conducted as a shared visit with non-physician practitioner(s) and myself.  I personally evaluated the patient during the encounter.   EKG Interpretation None     Patient here with constipation bowel discomfort. No vomiting noted. Will be given bowel treatment here and discharge when stable  Lorre NickAnthony Arfa Lamarca, MD 10/28/14 1825

## 2014-10-28 NOTE — ED Provider Notes (Signed)
CSN: [id number]     Arrival date & time 10/28/14  1442 History   First MD Initiated Contact with Patient 10/28/14 1505     Chief Complaint  Patient presents with  . Fecal Impaction  . Constipation   Patricia [name] is a 81 y.o. female with a history of hypertension who presents to the ED complaining of rectal pain and constipation for the past month. She complains of 10/10 rectal pain and reports she has not had a good bowel movement in about a month. She reports having small amounts of loose stool come out when trying to have a bowel movement. She reports going to her primary care provider's office yesterday who informed her she had a large amount of stool in her rectum and recommended a Fleet enema. The PCP told her to come to the ED if she was unable to do the Fleet enema herself. Patient reports she is here for her Fleet enema. The patient denies hematemesis or melena. The patient denies any abdominal pain, nausea or vomiting. Patient reports having history of hypertension and did not take her BP medication today. The patient denies fevers, chills, nausea, vomiting, hematochezia, melena, abdominal pain, headaches, lightheadedness, rashes or urinary symptoms.   (Consider location/radiation/quality/duration/timing/severity/associated sxs/prior Treatment) HPI  Past Medical History  Diagnosis Date  . HTN (hypertension)   . Renal insufficiency     a. Based on labs available in Epic.  Marland Kitchen Palpitations     a. Event monitor 11/2013: rare atrial ectopy, brief atrial runs (longest 1.25seconds), SVE complexes represent 0.82% of total beats, no atrial fibirillation seen, wandering pacemaker, slowest HR 40bpm.   Past Surgical History  Procedure Laterality Date  . Total knee arthroplasty      right  . Abdominal hysterectomy     Family History  Problem Relation Age of Onset  . Diabetes Father   . Diabetes Sister   . Diabetes Brother    History  Substance Use Topics  . Smoking status: Never Smoker    . Smokeless tobacco: Never Used  . Alcohol Use: No   OB History    No data available     Review of Systems  Constitutional: Negative for fever and chills.  HENT: Negative for ear pain.   Eyes: Negative for pain.  Respiratory: Negative for cough and shortness of breath.   Cardiovascular: Negative for chest pain.  Gastrointestinal: Positive for constipation and rectal pain. Negative for nausea, vomiting, abdominal pain and blood in stool.  Genitourinary: Negative for dysuria, urgency, frequency, hematuria and difficulty urinating.  Musculoskeletal: Negative for back pain.  Skin: Negative for rash.  Neurological: Negative for light-headedness and headaches.      Allergies  Review of patient's allergies indicates no known allergies.  Home Medications   Prior to Admission medications   Medication Sig Start Date End Date Taking? Authorizing Provider  acetaminophen (TYLENOL) 325 MG tablet Take 650 mg by mouth every 6 (six) hours as needed for pain or fever.   Yes Historical Provider, MD  amLODipine-benazepril (LOTREL) 10-20 MG per capsule Take 1 capsule by mouth daily.   Yes Historical Provider, MD  aspirin 81 MG chewable tablet Chew 81 mg by mouth daily.   Yes Historical Provider, MD  hydrochlorothiazide (HYDRODIURIL) 25 MG tablet Take 1 tablet by mouth daily. 09/25/14  Yes Historical Provider, MD  metoprolol (LOPRESSOR) 50 MG tablet Take 50 mg by mouth 2 (two) times daily.   Yes Historical Provider, MD  metoprolol tartrate (LOPRESSOR) 25 MG tablet Take  1 tablet (25 mg total) by mouth 2 (two) times daily. Patient not taking: Reported on 10/28/2014 03/01/14   Rollene Rotunda, MD   BP 184/48 mmHg  Pulse 56  Temp(Src) 97.9 F (36.6 C) (Oral)  Resp 16  Ht  (1.6 m)  Wt 140 lb (63.504 kg)  BMI 24.81 kg/m2  SpO2 100% Physical Exam  Constitutional: She appears well-developed and well-nourished. No distress.  Non-toxic appearing.   HENT:  Head: Normocephalic and atraumatic.   Mouth/Throat: Oropharynx is clear and moist. No oropharyngeal exudate.  Eyes: Conjunctivae are normal. Pupils are equal, round, and reactive to light. Right eye exhibits no discharge. Left eye exhibits no discharge.  Neck: Neck supple.  Cardiovascular: Normal rate, regular rhythm and intact distal pulses.   Pulmonary/Chest: Effort normal and breath sounds normal. No respiratory distress. She has no wheezes. She has no rales.  Abdominal: Soft. Bowel sounds are normal. She exhibits no distension and no mass. There is no tenderness. There is no rebound and no guarding.  Abdomen is soft and nontender to palpation. Bowel sounds are present.  Genitourinary:  Digital rectal exam performed by me with female RN as chaperone. No fecal impaction. Some firm stool deep in her rectum. Small external hemorrhoid. Brown stool. No gross bloody stool.   Musculoskeletal: She exhibits no edema.  Lymphadenopathy:    She has no cervical adenopathy.  Neurological: She is alert. Coordination normal.  Skin: Skin is warm and dry. No rash noted. She is not diaphoretic. No erythema. No pallor.  Psychiatric: She has a normal mood and affect. Her behavior is normal.  Nursing note and vitals reviewed.   ED Course  Procedures (including critical care time) Labs Review Labs Reviewed - No data to display  Imaging Review No results found.   EKG Interpretation None      Filed Vitals:   10/28/14 1454 10/28/14 1522 10/28/14 1736  BP: 198/56 204/68 184/48  Pulse: 56  56  Temp: 97.9 F (36.6 C)    TempSrc: Oral    Resp: 16  16  Height:  (1.6 m)    Weight: 140 lb (63.504 kg)    SpO2: 100%  100%     MDM   Meds given in ED:  Medications  milk and molasses enema (250 mLs Rectal Given 10/28/14 1608)    New Prescriptions   No medications on file    Final diagnoses:  Constipation, unspecified constipation type   This is a 81 y.o. female with a history of hypertension who presents to the ED complaining  of rectal pain and constipation for the past month. She complains of 10/10 rectal pain and reports she has not had a good bowel movement in about a month. She reports having small amounts of loose stool come out when trying to have a bowel movement.  She was sent by her PCP for an enema.  On exam she is afebrile and nontoxic appearing. Her abdomen is soft and nontender to palpation. There is no obvious fecal impaction however a large amount of stool deep in her rectal vault. No gross bloody stool.   17:45 At reevaluation after enema the patient is having large amount of formed stool in her bedside commode. She reports still having bowel movements and not ready to come off the toilet yet.   At second revaluation the patient has had several large formed stools on bedside toilet and reports feeling better and ready for discharge. Abdomen is soft and non-tender to palpation.  Will discharge with proctofoam and miralax and have her follow up with PCP. I advised the patient to follow-up with their primary care provider this week. I advised the patient to return to the emergency department with new or worsening symptoms or new concerns. The patient verbalized understanding and agreement with plan.    This patient was discussed with and evaluated by Dr. Freida BusmanAllen who agrees with assessment and plan.     Everlene FarrierWilliam Hetal Proano, PA-C 10/28/14 1932  Lorre NickAnthony Allen, MD 10/31/14 [phone]

## 2014-10-28 NOTE — ED Notes (Signed)
Pt presents to ED C/O constipation. Pt was at dr office yesterday b/c pt has not had "normal" BM for the past month but has had diarrhea. Dr told her that she has stool impacted that will not pass and suggested her to come to ED.

## 2015-02-17 ENCOUNTER — Emergency Department (HOSPITAL_COMMUNITY): Payer: Medicare Other

## 2015-02-17 ENCOUNTER — Encounter (HOSPITAL_COMMUNITY): Payer: Self-pay

## 2015-02-17 ENCOUNTER — Inpatient Hospital Stay (HOSPITAL_COMMUNITY)
Admission: EM | Admit: 2015-02-17 | Discharge: 2015-02-19 | DRG: 066 | Disposition: A | Discharge status: Skilled Nursing Facility | Payer: Medicare Other | Attending: Internal Medicine | Admitting: Internal Medicine

## 2015-02-17 DIAGNOSIS — G459 Transient cerebral ischemic attack, unspecified: Secondary | ICD-10-CM | POA: Diagnosis not present

## 2015-02-17 DIAGNOSIS — Z96651 Presence of right artificial knee joint: Secondary | ICD-10-CM | POA: Diagnosis present

## 2015-02-17 DIAGNOSIS — Z7902 Long term (current) use of antithrombotics/antiplatelets: Secondary | ICD-10-CM

## 2015-02-17 DIAGNOSIS — I10 Essential (primary) hypertension: Secondary | ICD-10-CM | POA: Diagnosis not present

## 2015-02-17 DIAGNOSIS — R001 Bradycardia, unspecified: Secondary | ICD-10-CM | POA: Diagnosis present

## 2015-02-17 DIAGNOSIS — E119 Type 2 diabetes mellitus without complications: Secondary | ICD-10-CM | POA: Diagnosis present

## 2015-02-17 DIAGNOSIS — E785 Hyperlipidemia, unspecified: Secondary | ICD-10-CM | POA: Diagnosis present

## 2015-02-17 DIAGNOSIS — Z7982 Long term (current) use of aspirin: Secondary | ICD-10-CM

## 2015-02-17 DIAGNOSIS — R4701 Aphasia: Secondary | ICD-10-CM | POA: Diagnosis present

## 2015-02-17 DIAGNOSIS — I639 Cerebral infarction, unspecified: Principal | ICD-10-CM | POA: Diagnosis present

## 2015-02-17 DIAGNOSIS — I129 Hypertensive chronic kidney disease with stage 1 through stage 4 chronic kidney disease, or unspecified chronic kidney disease: Secondary | ICD-10-CM | POA: Diagnosis present

## 2015-02-17 DIAGNOSIS — G458 Other transient cerebral ischemic attacks and related syndromes: Secondary | ICD-10-CM | POA: Diagnosis not present

## 2015-02-17 DIAGNOSIS — I272 Other secondary pulmonary hypertension: Secondary | ICD-10-CM | POA: Diagnosis present

## 2015-02-17 DIAGNOSIS — Z79899 Other long term (current) drug therapy: Secondary | ICD-10-CM

## 2015-02-17 DIAGNOSIS — N183 Chronic kidney disease, stage 3 (moderate): Secondary | ICD-10-CM | POA: Diagnosis present

## 2015-02-17 DIAGNOSIS — I6529 Occlusion and stenosis of unspecified carotid artery: Secondary | ICD-10-CM | POA: Insufficient documentation

## 2015-02-17 HISTORY — DX: Unspecified cataract: H26.9

## 2015-02-17 LAB — COMPREHENSIVE METABOLIC PANEL
ALT: 11 U/L — AB (ref 14–54)
AST: 21 U/L (ref 15–41)
Albumin: 3.6 g/dL (ref 3.5–5.0)
Alkaline Phosphatase: 72 U/L (ref 38–126)
Anion gap: 8 (ref 5–15)
BUN: 40 mg/dL — ABNORMAL HIGH (ref 6–20)
CHLORIDE: 109 mmol/L (ref 101–111)
CO2: 25 mmol/L (ref 22–32)
CREATININE: 1.59 mg/dL — AB (ref 0.44–1.00)
Calcium: 9.7 mg/dL (ref 8.9–10.3)
GFR calc non Af Amer: 27 mL/min — ABNORMAL LOW (ref >60)
GFR, EST AFRICAN AMERICAN: 31 mL/min — AB (ref >60)
Glucose, Bld: 120 mg/dL — ABNORMAL HIGH (ref 65–99)
Potassium: 4.3 mmol/L (ref 3.5–5.1)
SODIUM: 142 mmol/L (ref 135–145)
Total Bilirubin: 0.3 mg/dL (ref 0.3–1.2)
Total Protein: 7 g/dL (ref 6.5–8.1)

## 2015-02-17 LAB — CBC WITH DIFFERENTIAL/PLATELET
BASOS ABS: 0 10*3/uL (ref 0.0–0.1)
Basophils Relative: 0 % (ref 0–1)
EOS ABS: 0.2 10*3/uL (ref 0.0–0.7)
EOS PCT: 3 % (ref 0–5)
HCT: 32.6 % — ABNORMAL LOW (ref 36.0–46.0)
Hemoglobin: 10.5 g/dL — ABNORMAL LOW (ref 12.0–15.0)
LYMPHS ABS: 2.1 10*3/uL (ref 0.7–4.0)
Lymphocytes Relative: 34 % (ref 12–46)
MCH: 32.1 pg (ref 26.0–34.0)
MCHC: 32.2 g/dL (ref 30.0–36.0)
MCV: 99.7 fL (ref 78.0–100.0)
Monocytes Absolute: 0.6 10*3/uL (ref 0.1–1.0)
Monocytes Relative: 10 % (ref 3–12)
Neutro Abs: 3.3 10*3/uL (ref 1.7–7.7)
Neutrophils Relative %: 53 % (ref 43–77)
PLATELETS: 178 10*3/uL (ref 150–400)
RBC: 3.27 MIL/uL — AB (ref 3.87–5.11)
RDW: 12.9 % (ref 11.5–15.5)
WBC: 6.1 10*3/uL (ref 4.0–10.5)

## 2015-02-17 LAB — URINALYSIS, ROUTINE W REFLEX MICROSCOPIC
Bilirubin Urine: NEGATIVE
Glucose, UA: NEGATIVE mg/dL
Hgb urine dipstick: NEGATIVE
Ketones, ur: NEGATIVE mg/dL
NITRITE: NEGATIVE
Protein, ur: NEGATIVE mg/dL
SPECIFIC GRAVITY, URINE: 1.017 (ref 1.005–1.030)
UROBILINOGEN UA: 1 mg/dL (ref 0.0–1.0)
pH: 5.5 (ref 5.0–8.0)

## 2015-02-17 LAB — URINE MICROSCOPIC-ADD ON

## 2015-02-17 MED ORDER — HYDROCHLOROTHIAZIDE 25 MG PO TABS
25.0000 mg | ORAL_TABLET | Freq: Every day | ORAL | Status: DC
  Administered 2015-02-18 – 2015-02-19 (×2): 25 mg via ORAL
  Filled 2015-02-17 (×2): qty 1

## 2015-02-17 MED ORDER — ASPIRIN 81 MG PO CHEW: 81.0000 mg | CHEWABLE_TABLET | Freq: Every day | ORAL | Status: DC

## 2015-02-17 MED ORDER — ACETAMINOPHEN 325 MG PO TABS: 650.0000 mg | ORAL_TABLET | Freq: Four times a day (QID) | ORAL | Status: DC | PRN

## 2015-02-17 MED ORDER — HEPARIN SODIUM (PORCINE) 5000 UNIT/ML IJ SOLN
5000.0000 [IU] | Freq: Three times a day (TID) | INTRAMUSCULAR | Status: DC
  Administered 2015-02-18 – 2015-02-19 (×4): 5000 [IU] via SUBCUTANEOUS
  Filled 2015-02-17 (×4): qty 1

## 2015-02-17 MED ORDER — AMLODIPINE BESY-BENAZEPRIL HCL 10-20 MG PO CAPS: 1.0000 | ORAL_CAPSULE | Freq: Every day | ORAL | Status: DC

## 2015-02-17 MED ORDER — STROKE: EARLY STAGES OF RECOVERY BOOK
Freq: Once | Status: AC
  Administered 2015-02-18: 01:00:00
  Filled 2015-02-17: qty 1

## 2015-02-17 MED ORDER — POLYETHYLENE GLYCOL 3350 17 G PO PACK
17.0000 g | PACK | Freq: Two times a day (BID) | ORAL | Status: DC
  Administered 2015-02-18 – 2015-02-19 (×3): 17 g via ORAL
  Filled 2015-02-17 (×4): qty 1

## 2015-02-17 MED ORDER — METOPROLOL TARTRATE 50 MG PO TABS
50.0000 mg | ORAL_TABLET | Freq: Two times a day (BID) | ORAL | Status: DC
  Administered 2015-02-18: 50 mg via ORAL
  Filled 2015-02-17 (×2): qty 1

## 2015-02-17 NOTE — ED Notes (Signed)
Pts family member states that the pt has chronic issues with her eyes and has very limited vision,

## 2015-02-17 NOTE — H&P (Signed)
[facility] History and Physical  Patricia [name] ZOX:[id number] DOB: 28-Feb-1919 DOA: 02/17/2015  Referring physician: EDP PCP: Lupe Carney, MD   Chief Complaint: AMS   HPI: Patricia [name] is a 81 y.o. female brought in to ED today after neighbor found patient having aphasia and called 911.  LKW is unknown, initially in ED patient is apparently delirious asking for mother, not remembering SSN, name, etc.  But by the time of my evaluation her mental status seems improved.  She does still have some speech abnormality per family though and is not at baseline.  Review of Systems: Systems reviewed.  As above, otherwise negative  Past Medical History  Diagnosis Date  . HTN (hypertension)   . Renal insufficiency     a. Based on labs available in Epic.  Marland Kitchen Palpitations     a. Event monitor 11/2013: rare atrial ectopy, brief atrial runs (longest 1.25seconds), SVE complexes represent 0.82% of total beats, no atrial fibirillation seen, wandering pacemaker, slowest HR 40bpm.  . Cataract    Past Surgical History  Procedure Laterality Date  . Total knee arthroplasty      right  . Abdominal hysterectomy     Social History:  reports that she has never smoked. She has never used smokeless tobacco. She reports that she does not drink alcohol or use illicit drugs.  No Known Allergies  Family History  Problem Relation Age of Onset  . Diabetes Father   . Diabetes Sister   . Diabetes Brother      Prior to Admission medications   Medication Sig Start Date End Date Taking? Authorizing Provider  acetaminophen (TYLENOL) 325 MG tablet Take 650 mg by mouth every 6 (six) hours as needed for pain or fever.   Yes Historical Provider, MD  amLODipine-benazepril (LOTREL) 10-20 MG per capsule Take 1 capsule by mouth daily.   Yes Historical Provider, MD  aspirin 81 MG chewable tablet Chew 81 mg by mouth daily.   Yes Historical Provider, MD  hydrochlorothiazide (HYDRODIURIL) 25 MG tablet Take 1 tablet by  mouth daily. 09/25/14  Yes Historical Provider, MD  metoprolol (LOPRESSOR) 50 MG tablet Take 50 mg by mouth 2 (two) times daily.   Yes Historical Provider, MD  polyethylene glycol powder (GLYCOLAX/MIRALAX) powder Take 17 g by mouth 2 (two) times daily. Until daily soft stools 10/28/14  Yes Everlene Farrier, PA-C  pramoxine (PROCTOFOAM) 1 % foam Place 1 application rectally 3 (three) times daily as needed for itching. 10/28/14  Yes Everlene Farrier, PA-C   Physical Exam: Filed Vitals:   02/17/15 2315  BP: 165/54  Pulse: 53  Temp:   Resp: 18    BP 165/54 mmHg  Pulse 53  Temp(Src) 98.4 F (36.9 C) (Oral)  Resp 18  SpO2 100%  General Appearance:    Alert, oriented, no distress, appears stated age  Head:    Normocephalic, atraumatic  Eyes:    PERRL, EOMI, sclera non-icteric        Nose:   Nares without drainage or epistaxis. Mucosa, turbinates normal  Throat:   Moist mucous membranes. Oropharynx without erythema or exudate.  Neck:   Supple. No carotid bruits.  No thyromegaly.  No lymphadenopathy.   Back:     No CVA tenderness, no spinal tenderness  Lungs:     Clear to auscultation bilaterally, without wheezes, rhonchi or rales  Chest wall:    No tenderness to palpitation  Heart:    Regular rate and rhythm without murmurs, gallops, rubs  Abdomen:  Soft, non-tender, nondistended, normal bowel sounds, no organomegaly  Genitalia:    deferred  Rectal:    deferred  Extremities:   No clubbing, cyanosis or edema.  Pulses:   2+ and symmetric all extremities  Skin:   Skin color, texture, turgor normal, no rashes or lesions  Lymph nodes:   Cervical, supraclavicular, and axillary nodes normal  Neurologic:   Other than slightly slurred speech, no obvious focal deficit on exam    Labs on Admission:  Basic Metabolic Panel:  Recent Labs Lab 02/17/15 2112  NA 142  K 4.3  CL 109  CO2 25  GLUCOSE 120*  BUN 40*  CREATININE 1.59*  CALCIUM 9.7   Liver Function Tests:  Recent Labs Lab  02/17/15 2112  AST 21  ALT 11*  ALKPHOS 72  BILITOT 0.3  PROT 7.0  ALBUMIN 3.6   No results for input(s): LIPASE, AMYLASE in the last 168 hours. No results for input(s): AMMONIA in the last 168 hours. CBC:  Recent Labs Lab 02/17/15 2112  WBC 6.1  NEUTROABS 3.3  HGB 10.5*  HCT 32.6*  MCV 99.7  PLT 178   Cardiac Enzymes: No results for input(s): CKTOTAL, CKMB, CKMBINDEX, TROPONINI in the last 168 hours.  BNP (last 3 results) No results for input(s): PROBNP in the last 8760 hours. CBG: No results for input(s): GLUCAP in the last 168 hours.  Radiological Exams on Admission: Dg Chest 2 View  02/17/2015   CLINICAL DATA:  Altered level of consciousness today.  EXAM: CHEST  2 VIEW  COMPARISON:  09/30/2007  FINDINGS: Shallow inspiration. Normal heart size and pulmonary vascularity. Calcified and tortuous aorta. Mediastinal contents appear intact. Slight fibrosis or atelectasis in the left lung base. No blunting of costophrenic angles. No pneumothorax. No focal consolidation. Degenerative changes in the spine and shoulders.  IMPRESSION: Fibrosis or atelectasis in the left lung base. No evidence of active pulmonary disease.   Electronically Signed   By: Burman Nieves M.D.   On: 02/17/2015 21:38   Ct Head Wo Contrast  02/17/2015   CLINICAL DATA:  81 year old female with altered mental status.  EXAM: CT HEAD WITHOUT CONTRAST  TECHNIQUE: Contiguous axial images were obtained from the base of the skull through the vertex without intravenous contrast.  COMPARISON:  None.  FINDINGS: There is slight prominence of the ventricles and sulci compatible with age-related volume loss. Mild periventricular and deep white matter hypodensities represent chronic microvascular ischemic changes. There is no intracranial hemorrhage. No mass effect or midline shift identified.  The visualized paranasal sinuses and mastoid air cells are well aerated. The calvarium is intact.  IMPRESSION: No acute intracranial  pathology.  Mild age-related atrophy and chronic microvascular ischemic disease.  If symptoms persist and there are no contraindications, MRI may provide better evaluation if clinically indicated   Electronically Signed   By: Elgie Collard M.D.   On: 02/17/2015 21:57    EKG: Independently reviewed.  Assessment/Plan Principal Problem:   TIA (transient ischemic attack) Active Problems:   HTN (hypertension)   1. TIA - 1. Neurology recs TIA workup 2. TIA pathway 3. MRI pending 2. HTN - continue home meds  Neurology consulted.  Code Status: Full Code  Family Communication: Family at bedside Disposition Plan: Admit to obs   Time spent: 70 min  Ritchie Klee M. [facility] Pager [phone]  If 7AM-7PM, please contact the day team taking care of the patient [email or web address] Password [email or web address] 02/17/2015, 11:43 PM

## 2015-02-17 NOTE — ED Notes (Signed)
Pts family member states that the pt fell sometime today or yesterday.

## 2015-02-17 NOTE — ED Provider Notes (Signed)
CSN: [id number]     Arrival date & time 02/17/15  1951 History   First MD Initiated Contact with Patient 02/17/15 2008     Chief Complaint  Patient presents with  . Aphasia     HPI Per GCMES: pts family called neighbor who called 911 stating that the pt was having aphasia.LSN was not today and is unknown. Pt is having difficulty remembering name, social security number, is asking for her mother. Past Medical History  Diagnosis Date  . HTN (hypertension)   . Renal insufficiency     a. Based on labs available in Epic.  Marland Kitchen Palpitations     a. Event monitor 11/2013: rare atrial ectopy, brief atrial runs (longest 1.25seconds), SVE complexes represent 0.82% of total beats, no atrial fibirillation seen, wandering pacemaker, slowest HR 40bpm.  . Cataract    Past Surgical History  Procedure Laterality Date  . Total knee arthroplasty      right  . Abdominal hysterectomy     Family History  Problem Relation Age of Onset  . Diabetes Father   . Diabetes Sister   . Diabetes Brother    Social History  Substance Use Topics  . Smoking status: Never Smoker   . Smokeless tobacco: Never Used  . Alcohol Use: No   OB History    No data available     Review of Systems  All other systems reviewed and are negative.     Allergies  Review of patient's allergies indicates no known allergies.  Home Medications   Prior to Admission medications   Medication Sig Start Date End Date Taking? Authorizing Provider  acetaminophen (TYLENOL) 325 MG tablet Take 650 mg by mouth every 6 (six) hours as needed for pain or fever.   Yes Historical Provider, MD  amLODipine-benazepril (LOTREL) 10-20 MG per capsule Take 1 capsule by mouth daily.   Yes Historical Provider, MD  aspirin 81 MG chewable tablet Chew 81 mg by mouth daily.   Yes Historical Provider, MD  hydrochlorothiazide (HYDRODIURIL) 25 MG tablet Take 1 tablet by mouth daily. 09/25/14  Yes Historical Provider, MD  metoprolol (LOPRESSOR) 50 MG  tablet Take 50 mg by mouth 2 (two) times daily.   Yes Historical Provider, MD  polyethylene glycol powder (GLYCOLAX/MIRALAX) powder Take 17 g by mouth 2 (two) times daily. Until daily soft stools 10/28/14  Yes Everlene Farrier, PA-C  pramoxine (PROCTOFOAM) 1 % foam Place 1 application rectally 3 (three) times daily as needed for itching. 10/28/14  Yes Everlene Farrier, PA-C  metoprolol tartrate (LOPRESSOR) 25 MG tablet Take 1 tablet (25 mg total) by mouth 2 (two) times daily. Patient not taking: Reported on 10/28/2014 03/01/14   Rollene Rotunda, MD   BP 166/50 mmHg  Pulse 55  Temp(Src) 98.4 F (36.9 C) (Oral)  Resp 12  SpO2 100% Physical Exam  Constitutional: She appears well-developed and well-nourished. No distress.  HENT:  Head: Normocephalic and atraumatic.  Eyes: Pupils are equal, round, and reactive to light.  Neck: Normal range of motion.  Cardiovascular: Normal rate and intact distal pulses.   Pulmonary/Chest: No respiratory distress.  Abdominal: Normal appearance. She exhibits no distension.  Musculoskeletal: Normal range of motion.  Neurological: She is alert. No cranial nerve deficit. GCS eye subscore is 4. GCS verbal subscore is 5. GCS motor subscore is 6.  Skin: Skin is warm and dry. No rash noted.  Psychiatric: She has a normal mood and affect. Her behavior is normal.  Nursing note and vitals reviewed.  ED Course  Procedures (including critical care time) Labs Review Labs Reviewed  CBC WITH DIFFERENTIAL/PLATELET - Abnormal; Notable for the following:    RBC 3.27 (*)    Hemoglobin 10.5 (*)    HCT 32.6 (*)    All other components within normal limits  COMPREHENSIVE METABOLIC PANEL - Abnormal; Notable for the following:    Glucose, Bld 120 (*)    BUN 40 (*)    Creatinine, Ser 1.59 (*)    ALT 11 (*)    GFR calc non Af Amer 27 (*)    GFR calc Af Amer 31 (*)    All other components within normal limits  URINALYSIS, ROUTINE W REFLEX MICROSCOPIC (NOT AT [facility]) - Abnormal;  Notable for the following:    Leukocytes, UA SMALL (*)    All other components within normal limits  URINE MICROSCOPIC-ADD ON - Abnormal; Notable for the following:    Squamous Epithelial / LPF FEW (*)    All other components within normal limits    Imaging Review Dg Chest 2 View  02/17/2015   CLINICAL DATA:  Altered level of consciousness today.  EXAM: CHEST  2 VIEW  COMPARISON:  09/30/2007  FINDINGS: Shallow inspiration. Normal heart size and pulmonary vascularity. Calcified and tortuous aorta. Mediastinal contents appear intact. Slight fibrosis or atelectasis in the left lung base. No blunting of costophrenic angles. No pneumothorax. No focal consolidation. Degenerative changes in the spine and shoulders.  IMPRESSION: Fibrosis or atelectasis in the left lung base. No evidence of active pulmonary disease.   Electronically Signed   By: Burman Nieves M.D.   On: 02/17/2015 21:38   Ct Head Wo Contrast  02/17/2015   CLINICAL DATA:  81 year old female with altered mental status.  EXAM: CT HEAD WITHOUT CONTRAST  TECHNIQUE: Contiguous axial images were obtained from the base of the skull through the vertex without intravenous contrast.  COMPARISON:  None.  FINDINGS: There is slight prominence of the ventricles and sulci compatible with age-related volume loss. Mild periventricular and deep white matter hypodensities represent chronic microvascular ischemic changes. There is no intracranial hemorrhage. No mass effect or midline shift identified.  The visualized paranasal sinuses and mastoid air cells are well aerated. The calvarium is intact.  IMPRESSION: No acute intracranial pathology.  Mild age-related atrophy and chronic microvascular ischemic disease.  If symptoms persist and there are no contraindications, MRI may provide better evaluation if clinically indicated   Electronically Signed   By: Elgie Collard M.D.   On: 02/17/2015 21:57   I have personally reviewed and evaluated these images and lab  results as part of my medical decision-making.   EKG Interpretation   Date/Time:  Saturday February 17 2015 20:02:45 EDT Ventricular Rate:  56 PR Interval:  161 QRS Duration: 100 QT Interval:  467 QTC Calculation: 451 R Axis:   22 Text Interpretation:  Sinus rhythm Borderline T wave abnormalities  Confirmed by Radford Pax  MD, Haisley Arens ([id number]) on 02/17/2015 10:49:56 PM     Discussed with Dr. Thad Ranger of neurology requested patient be admitted to the hospitalist service for a TIA workup.  Hospitalist agreed to admit.  Temporary admit orders written.  Patient stable at time of admission. MDM   Final diagnoses:  Transient cerebral ischemia, unspecified transient cerebral ischemia type        Nelva Nay, MD 02/17/15 2325

## 2015-02-17 NOTE — ED Notes (Signed)
Per GCMES: pts family called neighbor who called 911 stating that the pt was having aphasia.LSN was not today and is unknown. Pt is having difficulty remembering name, social security number, is asking for her mother.   CBG 113

## 2015-02-17 NOTE — ED Notes (Signed)
Dr. Gardner at bedside 

## 2015-02-18 ENCOUNTER — Inpatient Hospital Stay (HOSPITAL_COMMUNITY): Payer: Medicare Other

## 2015-02-18 ENCOUNTER — Observation Stay (HOSPITAL_COMMUNITY): Payer: Medicare Other

## 2015-02-18 ENCOUNTER — Encounter (HOSPITAL_COMMUNITY): Payer: Medicare Other

## 2015-02-18 DIAGNOSIS — I129 Hypertensive chronic kidney disease with stage 1 through stage 4 chronic kidney disease, or unspecified chronic kidney disease: Secondary | ICD-10-CM | POA: Diagnosis present

## 2015-02-18 DIAGNOSIS — Z96651 Presence of right artificial knee joint: Secondary | ICD-10-CM | POA: Diagnosis present

## 2015-02-18 DIAGNOSIS — G459 Transient cerebral ischemic attack, unspecified: Secondary | ICD-10-CM

## 2015-02-18 DIAGNOSIS — E119 Type 2 diabetes mellitus without complications: Secondary | ICD-10-CM | POA: Diagnosis present

## 2015-02-18 DIAGNOSIS — I10 Essential (primary) hypertension: Secondary | ICD-10-CM | POA: Diagnosis not present

## 2015-02-18 DIAGNOSIS — I639 Cerebral infarction, unspecified: Secondary | ICD-10-CM | POA: Diagnosis present

## 2015-02-18 DIAGNOSIS — E785 Hyperlipidemia, unspecified: Secondary | ICD-10-CM | POA: Diagnosis not present

## 2015-02-18 DIAGNOSIS — N183 Chronic kidney disease, stage 3 (moderate): Secondary | ICD-10-CM | POA: Diagnosis not present

## 2015-02-18 DIAGNOSIS — Z7902 Long term (current) use of antithrombotics/antiplatelets: Secondary | ICD-10-CM | POA: Diagnosis not present

## 2015-02-18 DIAGNOSIS — I6522 Occlusion and stenosis of left carotid artery: Secondary | ICD-10-CM | POA: Diagnosis not present

## 2015-02-18 DIAGNOSIS — R001 Bradycardia, unspecified: Secondary | ICD-10-CM | POA: Diagnosis not present

## 2015-02-18 DIAGNOSIS — R4701 Aphasia: Secondary | ICD-10-CM | POA: Diagnosis present

## 2015-02-18 DIAGNOSIS — I6350 Cerebral infarction due to unspecified occlusion or stenosis of unspecified cerebral artery: Secondary | ICD-10-CM | POA: Diagnosis not present

## 2015-02-18 DIAGNOSIS — Z79899 Other long term (current) drug therapy: Secondary | ICD-10-CM | POA: Diagnosis not present

## 2015-02-18 DIAGNOSIS — Z7982 Long term (current) use of aspirin: Secondary | ICD-10-CM | POA: Diagnosis not present

## 2015-02-18 DIAGNOSIS — I272 Other secondary pulmonary hypertension: Secondary | ICD-10-CM | POA: Diagnosis present

## 2015-02-18 LAB — LIPID PANEL
Cholesterol: 189 mg/dL (ref 0–200)
HDL: 70 mg/dL (ref >40)
LDL Cholesterol: 111 mg/dL — ABNORMAL HIGH (ref 0–99)
Total CHOL/HDL Ratio: 2.7 RATIO
Triglycerides: 39 mg/dL (ref <150)
VLDL: 8 mg/dL (ref 0–40)

## 2015-02-18 LAB — TSH: TSH: 2.234 u[IU]/mL (ref 0.350–4.500)

## 2015-02-18 MED ORDER — AMLODIPINE BESYLATE 10 MG PO TABS
10.0000 mg | ORAL_TABLET | Freq: Every day | ORAL | Status: DC
  Administered 2015-02-18 – 2015-02-19 (×2): 10 mg via ORAL
  Filled 2015-02-18 (×2): qty 1

## 2015-02-18 MED ORDER — SIMVASTATIN 20 MG PO TABS
20.0000 mg | ORAL_TABLET | Freq: Every day | ORAL | Status: DC
  Administered 2015-02-18: 20 mg via ORAL
  Filled 2015-02-18: qty 1

## 2015-02-18 MED ORDER — ASPIRIN 81 MG PO CHEW
81.0000 mg | CHEWABLE_TABLET | Freq: Every day | ORAL | Status: DC
  Administered 2015-02-19: 81 mg via ORAL
  Filled 2015-02-18: qty 1

## 2015-02-18 MED ORDER — BENAZEPRIL HCL 20 MG PO TABS
20.0000 mg | ORAL_TABLET | Freq: Every day | ORAL | Status: DC
  Administered 2015-02-18 – 2015-02-19 (×2): 20 mg via ORAL
  Filled 2015-02-18 (×2): qty 1

## 2015-02-18 MED ORDER — CLOPIDOGREL BISULFATE 75 MG PO TABS
75.0000 mg | ORAL_TABLET | Freq: Every day | ORAL | Status: DC
  Administered 2015-02-18 – 2015-02-19 (×2): 75 mg via ORAL
  Filled 2015-02-18 (×2): qty 1

## 2015-02-18 MED ORDER — ASPIRIN 81 MG PO CHEW
324.0000 mg | CHEWABLE_TABLET | Freq: Every day | ORAL | Status: DC
  Administered 2015-02-18: 324 mg via ORAL
  Filled 2015-02-18: qty 4

## 2015-02-18 NOTE — Progress Notes (Signed)
STROKE TEAM PROGRESS NOTE  HPI Patricia [name] is an 81 y.o. female who lives alone. Was able to visit with a family member around 5p and reports that there were no problems noted at that time. When she spoke to someone on the phone at around 1730 she noticed that she was unable to get her words out and seemed confused. EMS was called at that time. Initially the patient was quite confused but improved in the ED. She has not returned to baseline.   Date last known well: Date: 02/17/2015 Time last known well: Time: 17:00 tPA Given: No: Improvement in symptoms    SUBJECTIVE (INTERVAL HISTORY) No family members present. The patient states that she fell at home and struck her head which led to the space difficulties and mild confusion. She feels back to baseline now. She is anxious for discharge.     OBJECTIVE Temp:  [97.6 F (36.4 C)-98.4 F (36.9 C)] 98.1 F (36.7 C) (09/11 0757) Pulse Rate:  [44-55] 44 (09/11 0757) Cardiac Rhythm:  [-] Sinus bradycardia (09/11 0800) Resp:  [10-19] 16 (09/11 0757) BP: (142-183)/(35-80) 149/49 mmHg (09/11 0757) SpO2:  [97 %-100 %] 99 % (09/11 0757) Weight:  [64.275 kg (141 lb 11.2 oz)] 64.275 kg (141 lb 11.2 oz) (09/11 0045)  CBC:  Recent Labs Lab 02/17/15 2112  WBC 6.1  NEUTROABS 3.3  HGB 10.5*  HCT 32.6*  MCV 99.7  PLT 178    Basic Metabolic Panel:  Recent Labs Lab 02/17/15 2112  NA 142  K 4.3  CL 109  CO2 25  GLUCOSE 120*  BUN 40*  CREATININE 1.59*  CALCIUM 9.7    Lipid Panel:    Component Value Date/Time   CHOL 189 02/18/2015 0715   TRIG 39 02/18/2015 0715   HDL 70 02/18/2015 0715   CHOLHDL 2.7 02/18/2015 0715   VLDL 8 02/18/2015 0715   LDLCALC 111* 02/18/2015 0715   HgbA1c: No results found for: HGBA1C Urine Drug Screen: No results found for: LABOPIA, COCAINSCRNUR, LABBENZ, AMPHETMU, THCU, LABBARB    IMAGING  Dg Chest 2 View 02/17/2015    Fibrosis or atelectasis in the left lung base. No evidence of active  pulmonary disease.      Ct Head Wo Contrast 02/17/2015    No acute intracranial pathology.  Mild age-related atrophy and chronic microvascular ischemic disease.  If symptoms persist and there are no contraindications, MRI may provide better evaluation if clinically indicated      Mr Maxine Glenn Head/brain Wo Cm 02/18/2015    MRI HEAD   1. Small patchy multi focal acute ischemic infarcts within the left periatrial white matter and overlying cortical gray matter and subcortical white matter of the posterior left frontal lobe and left parietal lobe. No associated hemorrhage or mass effect.  2. Generalized cerebral atrophy with mild chronic small vessel ischemic disease.  3. 9 mm cystic lesion at the posterior aspect of the right globe in the region of the macula, likely a small macular cyst related to macular degeneration. Correlation with physical exam and direct visualization as clinically desired.    MRA HEAD 1. No acute large or proximal arterial branch occlusion identified within the intracranial circulation.  2. Nonvisualization of the right vertebral artery, likely occluded. Left vertebral artery widely patent to the vertebrobasilar junction.  3. Focal short segment severe stenosis within the proximal left M1 segment, with a slightly less severe short-segment moderate to severe stenosis within the proximal right M1 segment.  4. Short segment severe  stenosis within the proximal left P2 segment without occlusion.  5. Atheromatous irregularity with severe stenosis at the supraclinoid right ICA, with similar but slightly less severe stenosis at the supraclinoid left ICA.        Neurologic Exam Mental Status: Alert, oriented except date, thought content appropriate.  Speech fluent without evidence of aphasia.  Able to follow 3 step commands without difficulty. Cranial Nerves: II: Visual fields grossly normal, pupils equal, round, reactive to light and accommodation. Poor visual acuity. III,IV, VI:  ptosis not present, extra-ocular motions intact bilaterally V,VII: smile symmetric, facial light touch sensation normal bilaterally VIII: hearing normal bilaterally IX,X: gag reflex present XI: bilateral shoulder shrug XII: midline tongue extension Motor: Right : Upper extremity   5/5    Left:     Upper extremity   5/5  Lower extremity   5/5     Lower extremity   5/5 Tone and bulk:normal tone throughout; no atrophy noted Sensory: Light touch intact throughout, bilaterally  Cerebellar: normal finger-to-nose Gait: Not tested    ASSESSMENT/PLAN [name] is a 81 y.o. female with history of hypertension, renal insufficiency, and palpitations presenting with confusion and speech difficulties. She did not receive IV t-PA due to improvement in deficits.  Strokes:  Dominant possibly embolic from severe cerebrovascular disease.  Resultant resolution of deficits  MRI  Small patchy multi focal acute left ischemic infarcts.  MRA  severe diffuse cerebrovascular disease.  Carotid Doppler pending  2D Echo  pending  LDL 111  HgbA1c pending  VTE prophylaxis - subcutaneous heparin  Diet Heart Room service appropriate?: Yes; Fluid consistency:: Thin  aspirin 81 mg orally every day prior to admission, now on aspirin 325 mg orally every day  Patient counseled to be compliant with her antithrombotic medications  Ongoing aggressive stroke risk factor management  Therapy recommendations: Pending   Disposition: Pending  Hypertension  Stable  Permissive hypertension (OK if < 220/120) but gradually normalize in 5-7 days  Hyperlipidemia  Home meds:  No lipid lowering medications prior to admission.   LDL 111, goal < 70  Add Zocor 20 mg daily  Continue statin at discharge  Diabetes  HgbA1c pending, goal < 7.0  Controlled  Other Stroke Risk Factors  Advanced age    Other Active Problems  Anemia  PLAN  Dual antiplatelets therapy for diffuse severe  cerebrovascular disease.  Hospital day #   Delton See PA-C [facility] Pager [phone] 02/18/2015, 10:52 AM     To contact Stroke Continuity provider, please refer to [email or web address]. After hours, contact General Neurology

## 2015-02-18 NOTE — Progress Notes (Signed)
*  PRELIMINARY RESULTS* Vascular Ultrasound Carotid Duplex (Doppler) has been completed.  Findings suggest upper range 40-59% right internal carotid artery stenosis. Elevated left internal carotid artery systolic velocities and ICA/CCA ratio suggest >80% stenosis, however end diastolic velocities suggest upper range 1-39% stenosis. Bilateral vertebral arteries are patent with antegrade flow.  02/18/2015 3:38 PM Gertie Fey, RVT, RDCS, RDMS

## 2015-02-18 NOTE — Progress Notes (Signed)
OT Cancellation Note  Patient Details Name: Patricia [name] MRN: [id number] DOB: Dec 17, 1918   Cancelled Treatment:    Reason Eval/Treat Not Completed:  Pt on bed rest. Please update activity orders when pt is appropriate for OT evaluation.  Earlie Raveling OTR/L [phone] 02/18/2015, 8:14 AM

## 2015-02-18 NOTE — Progress Notes (Signed)
  Echocardiogram 2D Echocardiogram has been performed.  Patricia [name] 02/18/2015, 4:30 PM

## 2015-02-18 NOTE — Evaluation (Signed)
Physical Therapy Evaluation Patient Details Name: Patricia [name] MRN: [id number] DOB: [birth year] Today's Date: 02/18/2015   History of Present Illness  81 y.o. female admitted for acute ischemic stroke. MRI brain shows patchy multifocal acute ischemic infarct in the left frontal/parietal lobe. MRA brain shows diffuse cerebrovascular disease.  Clinical Impression  Pt admitted with the above complications. Pt currently with functional limitations due to the deficits listed below (see PT Problem List). Demonstrates decreased safety awareness. Close guard for mobility with some instability of right knee and moderate sway with slight drift to left. Uses cane for mobility today, not a good historian of prior level of function and no family available to confirm. Pt is a fall risk. Limited assistance at home and may therefore benefit most from short term SNF to improve safety with mobility and allow to be more independent prior to returning home. Pt will benefit from skilled PT to increase their independence and safety with mobility to allow discharge to the venue listed below.       Follow Up Recommendations SNF;Supervision for mobility/OOB    Equipment Recommendations  None recommended by PT    Recommendations for Other Services Speech consult (Cognitive evaluation)     Precautions / Restrictions Precautions Precautions: Fall Restrictions Weight Bearing Restrictions: No      Mobility  Bed Mobility Overal bed mobility: Needs Assistance Bed Mobility: Supine to Sit     Supine to sit: Supervision     General bed mobility comments: Extra time. Cues for technique and supervision for safety.  Transfers Overall transfer level: Needs assistance Equipment used: Straight cane Transfers: Sit to/from Stand Sit to Stand: Min guard         General transfer comment: Close guard for safety. Performed several times from bed and recliner. VC for hand placement. Slow to rise and using back of  knees on bed/chair for support.  Ambulation/Gait Ambulation/Gait assistance: Min guard Ambulation Distance (Feet): 80 Feet Assistive device: Straight cane Gait Pattern/deviations: Step-through pattern;Decreased stride length;Drifts right/left;Narrow base of support Gait velocity: decreased   General Gait Details: Very close guard at all times. Drifting to left initially, with moderate sway in all directions. Uses cane for support. Rt knee with rapid hyperextension in stance phase. Cues to prevent hyperextension with poor response. VC for awareness of balance deficits which she seems to be unaware of.  Stairs            Wheelchair Mobility    Modified Rankin (Stroke Patients Only) Modified Rankin (Stroke Patients Only) Pre-Morbid Rankin Score: Slight disability Modified Rankin: Moderately severe disability     Balance Overall balance assessment: Needs assistance;History of Falls Sitting-balance support: No upper extremity supported;Feet supported Sitting balance-Leahy Scale: Fair     Standing balance support: No upper extremity supported Standing balance-Leahy Scale: Fair   Single Leg Stance - Right Leg: 0 Single Leg Stance - Left Leg: 0     Rhomberg - Eyes Opened: 30                   Pertinent Vitals/Pain Pain Assessment: No/denies pain    Home Living Family/patient expects to be discharged to:: Private residence Living Arrangements: Alone Available Help at Discharge: Family (sister, lives near by, not in good health.) Type of Home: Apartment Home Access: Stairs to enter Entrance Stairs-Rails: Left Entrance Stairs-Number of Steps: 12 Home Layout:  (unsure) Home Equipment: Cane - single point;Walker - 2 wheels      Prior Function Level of Independence: Independent with assistive  device(s)         Comments: Uses cane for mobility     Hand Dominance   Dominant Hand: Right    Extremity/Trunk Assessment   Upper Extremity Assessment: Defer to  OT evaluation           Lower Extremity Assessment: Generalized weakness;RLE deficits/detail RLE Deficits / Details: Rt knee with some instability, looks like hx of Rt knee arthroplasty from anterior scar. Strength unremarkable grossly 4+/5 with Rt ankle DF 4/5.       Communication   Communication: Expressive difficulties (seems to have some word finding difficulties)  Cognition Arousal/Alertness: Awake/alert Behavior During Therapy: WFL for tasks assessed/performed Overall Cognitive Status: No family/caregiver present to determine baseline cognitive functioning Area of Impairment: Safety/judgement;Problem solving (Unaware of her own birthday but knew current year/month)         Safety/Judgement: Decreased awareness of deficits   Problem Solving: Slow processing;Difficulty sequencing General Comments: Seems to have difficulty with word finding and expressing intended statements.     General Comments General comments (skin integrity, edema, etc.): Time spent discussing d/c planning and assistance available from friends/family but is limited.    Exercises        Assessment/Plan    PT Assessment Patient needs continued PT services  PT Diagnosis Difficulty walking;Abnormality of gait;Altered mental status   PT Problem List Decreased strength;Decreased range of motion;Decreased activity tolerance;Decreased balance;Decreased mobility;Decreased coordination;Decreased cognition;Decreased knowledge of use of DME;Decreased safety awareness  PT Treatment Interventions DME instruction;Gait training;Functional mobility training;Therapeutic activities;Therapeutic exercise;Balance training;Neuromuscular re-education;Cognitive remediation;Patient/family education   PT Goals (Current goals can be found in the Care Plan section) Acute Rehab PT Goals Patient Stated Goal: Go home and get my pocket book PT Goal Formulation: With patient Time For Goal Achievement: 03/04/15 Potential to  Achieve Goals: Good    Frequency Min 4X/week   Barriers to discharge Decreased caregiver support;Inaccessible home environment lives alone on second story of apartment.    Co-evaluation               End of Session Equipment Utilized During Treatment: Gait belt Activity Tolerance: Patient tolerated treatment well Patient left: in chair;with call bell/phone within reach;with chair alarm set Nurse Communication: Mobility status         Time: 1610-9604 PT Time Calculation (min) (ACUTE ONLY): 34 min   Charges:   PT Evaluation $Initial PT Evaluation Tier I: 1 Procedure PT Treatments $Gait Training: 8-22 mins   PT G CodesBerton Mount 02/18/2015, 5:46 PM Sunday Spillers [place], [place] [phone]

## 2015-02-18 NOTE — Consult Note (Signed)
Referring Physician: Radford Pax    Chief Complaint: Difficulty with speech  HPI: Patricia [name] is an 81 y.o. female who lives alone.  Was able to visit with a family member around 5p and reports that there were no problems noted at that time.  When she spoke to someone on the phone at around 1730 she noticed that she was unable to get her words out and seemed confused.  EMS was called at that time. Initially the patient was quite confused but improved in the ED.  She has not returned to baseline.    Date last known well: Date: 02/17/2015 Time last known well: Time: 17:00 tPA Given: No: Improvement in symptoms  MRankin: 0  Past Medical History  Diagnosis Date  . HTN (hypertension)   . Renal insufficiency     a. Based on labs available in Epic.  Marland Kitchen Palpitations     a. Event monitor 11/2013: rare atrial ectopy, brief atrial runs (longest 1.25seconds), SVE complexes represent 0.82% of total beats, no atrial fibirillation seen, wandering pacemaker, slowest HR 40bpm.  . Cataract     Past Surgical History  Procedure Laterality Date  . Total knee arthroplasty      right  . Abdominal hysterectomy      Family History  Problem Relation Age of Onset  . Diabetes Father   . Diabetes Sister   . Diabetes Brother    Social History:  reports that she has never smoked. She has never used smokeless tobacco. She reports that she does not drink alcohol or use illicit drugs.  Allergies: No Known Allergies  Medications:  I have reviewed the patient's current medications. Prior to Admission:  Prescriptions prior to admission  Medication Sig Dispense Refill Last Dose  . acetaminophen (TYLENOL) 325 MG tablet Take 650 mg by mouth every 6 (six) hours as needed for pain or fever.   Past Month at Unknown time  . amLODipine-benazepril (LOTREL) 10-20 MG per capsule Take 1 capsule by mouth daily.   02/17/2015 at Unknown time  . aspirin 81 MG chewable tablet Chew 81 mg by mouth daily.   02/17/2015 at Unknown time   . hydrochlorothiazide (HYDRODIURIL) 25 MG tablet Take 1 tablet by mouth daily.  1 02/17/2015 at Unknown time  . metoprolol (LOPRESSOR) 50 MG tablet Take 50 mg by mouth 2 (two) times daily.   02/17/2015 at 0500  . polyethylene glycol powder (GLYCOLAX/MIRALAX) powder Take 17 g by mouth 2 (two) times daily. Until daily soft stools 250 g 0 02/17/2015 at Unknown time  . pramoxine (PROCTOFOAM) 1 % foam Place 1 application rectally 3 (three) times daily as needed for itching. 15 g 0 Past Month at Unknown time   Scheduled: . amLODipine  10 mg Oral Daily   And  . benazepril  20 mg Oral Daily  . aspirin  81 mg Oral Daily  . heparin  5,000 Units Subcutaneous 3 times per day  . hydrochlorothiazide  25 mg Oral Daily  . metoprolol  50 mg Oral BID  . polyethylene glycol  17 g Oral BID    ROS: History obtained from the patient  General ROS: negative for - chills, fatigue, fever, night sweats, weight gain or weight loss Psychological ROS: negative for - behavioral disorder, hallucinations, memory difficulties, mood swings or suicidal ideation Ophthalmic ROS: negative for - blurry vision, double vision, eye pain or loss of vision ENT ROS: negative for - epistaxis, nasal discharge, oral lesions, sore throat, tinnitus or vertigo Allergy and Immunology ROS:  negative for - hives or itchy/watery eyes Hematological and Lymphatic ROS: negative for - bleeding problems, bruising or swollen lymph nodes Endocrine ROS: negative for - galactorrhea, hair pattern changes, polydipsia/polyuria or temperature intolerance Respiratory ROS: negative for - cough, hemoptysis, shortness of breath or wheezing Cardiovascular ROS: negative for - chest pain, dyspnea on exertion, edema or irregular heartbeat Gastrointestinal ROS: negative for - abdominal pain, diarrhea, hematemesis, nausea/vomiting or stool incontinence Genito-Urinary ROS: negative for - dysuria, hematuria, incontinence or urinary frequency/urgency Musculoskeletal  ROS: negative for - joint swelling or muscular weakness Neurological ROS: as noted in HPI Dermatological ROS: negative for rash and skin lesion changes   Physical Examination: Blood pressure 183/54, pulse 54, temperature 97.9 F (36.6 C), temperature source Oral, resp. rate 16, weight 64.275 kg (141 lb 11.2 oz), SpO2 97 %.  GEN- NAD HEENT-  Normocephalic, no lesions, without obvious abnormality.  Normal external eye and conjunctiva.  Normal TM's bilaterally.  Normal auditory canals and external ears. Normal external nose, mucus membranes and septum.  Normal pharynx. Cardiovascular- S1, S2 normal, +murmur, pulses palpable throughout   Lungs- chest clear, no wheezing, rales, normal symmetric air entry Abdomen- soft, non-tender; bowel sounds normal; no masses,  no organomegaly Extremities- 1+ BLE edema Lymph-no adenopathy palpable Musculoskeletal-no joint tenderness, deformity or swelling Skin-warm and dry, no hyperpigmentation, vitiligo, or suspicious lesions  Neurological Examination Mental Status: Alert, oriented, thought content appropriate.  Speech nonfluent.  Word finding difficulties.  Requires some reinforcement for 3 step commands. Cranial Nerves: II: Discs flat bilaterally; Visual fields grossly normal, pupils equal, round, reactive to light and accommodation III,IV, VI: ptosis not present, extra-ocular motions intact bilaterally V,VII: decrease in right NLF, facial light touch sensation normal bilaterally VIII: hearing normal bilaterally IX,X: gag reflex present XI: bilateral shoulder shrug XII: midline tongue extension Motor: Right : Upper extremity   5/5    Left:     Upper extremity   5/5  Lower extremity   5/5     Lower extremity   5/5 Tone and bulk:normal tone throughout; no atrophy noted Sensory: Pinprick and light touch intact throughout, bilaterally Deep Tendon Reflexes: 2+ and symmetric with absent AJ's bilaterally Plantars: Right: mute   Left:  mute Cerebellar: normal finger-to-nose and normal heel-to-shin testing bilaterally    Laboratory Studies:  Basic Metabolic Panel:  Recent Labs Lab 02/17/15 2112  NA 142  K 4.3  CL 109  CO2 25  GLUCOSE 120*  BUN 40*  CREATININE 1.59*  CALCIUM 9.7    Liver Function Tests:  Recent Labs Lab 02/17/15 2112  AST 21  ALT 11*  ALKPHOS 72  BILITOT 0.3  PROT 7.0  ALBUMIN 3.6   No results for input(s): LIPASE, AMYLASE in the last 168 hours. No results for input(s): AMMONIA in the last 168 hours.  CBC:  Recent Labs Lab 02/17/15 2112  WBC 6.1  NEUTROABS 3.3  HGB 10.5*  HCT 32.6*  MCV 99.7  PLT 178    Cardiac Enzymes: No results for input(s): CKTOTAL, CKMB, CKMBINDEX, TROPONINI in the last 168 hours.  BNP: Invalid input(s): POCBNP  CBG: No results for input(s): GLUCAP in the last 168 hours.  Microbiology: No results found for this or any previous visit.  Coagulation Studies: No results for input(s): LABPROT, INR in the last 72 hours.  Urinalysis:  Recent Labs Lab 02/17/15 2113  COLORURINE YELLOW  LABSPEC 1.017  PHURINE 5.5  GLUCOSEU NEGATIVE  HGBUR NEGATIVE  BILIRUBINUR NEGATIVE  KETONESUR NEGATIVE  PROTEINUR NEGATIVE  UROBILINOGEN  1.0  NITRITE NEGATIVE  LEUKOCYTESUR SMALL*    Lipid Panel: No results found for: CHOL, TRIG, HDL, CHOLHDL, VLDL, LDLCALC  HgbA1C: No results found for: HGBA1C  Urine Drug Screen:  No results found for: LABOPIA, COCAINSCRNUR, LABBENZ, AMPHETMU, THCU, LABBARB  Alcohol Level: No results for input(s): ETH in the last 168 hours.  Other results: EKG: sinus rhythm at 56 bpm.  Imaging: Dg Chest 2 View  02/17/2015   CLINICAL DATA:  Altered level of consciousness today.  EXAM: CHEST  2 VIEW  COMPARISON:  09/30/2007  FINDINGS: Shallow inspiration. Normal heart size and pulmonary vascularity. Calcified and tortuous aorta. Mediastinal contents appear intact. Slight fibrosis or atelectasis in the left lung base. No blunting  of costophrenic angles. No pneumothorax. No focal consolidation. Degenerative changes in the spine and shoulders.  IMPRESSION: Fibrosis or atelectasis in the left lung base. No evidence of active pulmonary disease.   Electronically Signed   By: Burman Nieves M.D.   On: 02/17/2015 21:38   Ct Head Wo Contrast  02/17/2015   CLINICAL DATA:  81 year old female with altered mental status.  EXAM: CT HEAD WITHOUT CONTRAST  TECHNIQUE: Contiguous axial images were obtained from the base of the skull through the vertex without intravenous contrast.  COMPARISON:  None.  FINDINGS: There is slight prominence of the ventricles and sulci compatible with age-related volume loss. Mild periventricular and deep white matter hypodensities represent chronic microvascular ischemic changes. There is no intracranial hemorrhage. No mass effect or midline shift identified.  The visualized paranasal sinuses and mastoid air cells are well aerated. The calvarium is intact.  IMPRESSION: No acute intracranial pathology.  Mild age-related atrophy and chronic microvascular ischemic disease.  If symptoms persist and there are no contraindications, MRI may provide better evaluation if clinically indicated   Electronically Signed   By: Elgie Collard M.D.   On: 02/17/2015 21:57    Assessment: 81 y.o. female presenting with aphasia.  Symptoms improved but patient not back to baseline.  Remaining neurological examination unremarkable.  Head CT personally reviewed and shows no acute changes.  Can not rule out acute infarct.  Further work up recommended.  Patient on ASA at home.    Stroke Risk Factors - hypertension  Plan: 1. HgbA1c, fasting lipid panel 2. MRI, MRA  of the brain without contrast 3. PT consult, OT consult, Speech consult 4. Echocardiogram 5. Carotid dopplers 6. Prophylactic therapy-Antiplatelet med: Aspirin - dose 325mg  daily 7. NPO until RN stroke swallow screen 8. Telemetry monitoring 9. Frequent neuro  checks   Thana Farr, MD [facility] [phone] 02/18/2015, 1:52 AM

## 2015-02-18 NOTE — Progress Notes (Signed)
Pt arrived on unit 0040hrs A&O, no obvious distress, NIHSS 3. Contact isolation per ED for possible bed bugs as reported via EMS from pt neighbor, No visual evidence confirming at this time. On call MD notified of pt arrival to unit, pt oriented to room/equipment, admission orders implemented

## 2015-02-18 NOTE — Progress Notes (Signed)
PATIENT DETAILS Name: Patricia [name] Age: 81 y.o. Sex: female Date of Birth: 1919-01-22 Admit Date: 02/17/2015 Admitting Physician Hillary Bow, DO ZOX:WRUE Clovis Riley, MD  Subjective: Speech seems to be much better than on initial presentation. No other complaints.  Assessment/Plan: Principal Problem: Acute ischemic stroke: Speech improved. MRI brain shows patchy multifocal acute ischemic infarct in the left frontal/parietal lobe. MRA brain shows diffuse cerebrovascular disease. LDL 111 (goal <70)-started statin. A1c pending. Spoke with neurology-recommendations to start aspirin and Plavix. Await echo and carotid.  Active Problems: Dyslipidemia: LDL 111 (goal <70)-started statin.  HTN (hypertension): Blood pressure Fluctuating-continue amlodipine, benazepril and HCTZ. Hold metoprolol given significant bradycardia. For now and adjust accordingly  Sinus Bradycardia: Hold metoprolol, check TSH. Asymptomatic.  Chronic kidney disease stage III: Repeat electrolytes in a.m.  Disposition: Remain inpatient-suspect an discharge 9/12 once workup is complete  Antimicrobial agents  See below  Anti-infectives    None      DVT Prophylaxis: Prophylactic Heparin   Code Status: Full code   Family Communication None at bedside  Procedures: None  CONSULTS:  neurology  Time spent 30 minutes-Greater than 50% of this time was spent in counseling, explanation of diagnosis, planning of further management, and coordination of care.  MEDICATIONS: Scheduled Meds: . amLODipine  10 mg Oral Daily   And  . benazepril  20 mg Oral Daily  . aspirin  324 mg Oral Daily  . heparin  5,000 Units Subcutaneous 3 times per day  . hydrochlorothiazide  25 mg Oral Daily  . metoprolol  50 mg Oral BID  . polyethylene glycol  17 g Oral BID  . simvastatin  20 mg Oral q1800   Continuous Infusions:  PRN Meds:.acetaminophen    PHYSICAL EXAM: Vital signs in last 24 hours: Filed  Vitals:   02/18/15 0946 02/18/15 1234 02/18/15 1400 02/18/15 1432  BP: 183/44   150/48  Pulse: 44 46  48  Temp: 97.8 F (36.6 C)   98.4 F (36.9 C)  TempSrc: Oral   Oral  Resp: 17 16  16   Height:   5\' 7"  (1.702 m)   Weight:   64 kg (141 lb 1.5 oz)   SpO2: 100% 100%  100%    Weight change:  Filed Weights   02/18/15 0045 02/18/15 1400  Weight: 64.275 kg (141 lb 11.2 oz) 64 kg (141 lb 1.5 oz)   Body mass index is 22.09 kg/(m^2).   Gen Exam: Awake and alert-mild dysarthria Neck: Supple, No JVD.   Chest: B/L Clear.   CVS: S1 S2 Regular, no murmurs.  Abdomen: soft, BS +, non tender, non distended.  Extremities: + edema, lower extremities warm to touch. Neurologic: Non Focal.   Skin: No Rash.   Wounds: N/A.   Intake/Output from previous day:  Intake/Output Summary (Last 24 hours) at 02/18/15 1445 Last data filed at 02/18/15 0200  Gross per 24 hour  Intake    240 ml  Output      0 ml  Net    240 ml     LAB RESULTS: CBC  Recent Labs Lab 02/17/15 2112  WBC 6.1  HGB 10.5*  HCT 32.6*  PLT 178  MCV 99.7  MCH 32.1  MCHC 32.2  RDW 12.9  LYMPHSABS 2.1  MONOABS 0.6  EOSABS 0.2  BASOSABS 0.0    Chemistries   Recent Labs Lab 02/17/15 2112  NA 142  K 4.3  CL 109  CO2 25  GLUCOSE 120*  BUN 40*  CREATININE 1.59*  CALCIUM 9.7    CBG: No results for input(s): GLUCAP in the last 168 hours.  GFR Estimated Creatinine Clearance: 21 mL/min (by C-G formula based on Cr of 1.59).  Coagulation profile No results for input(s): INR, PROTIME in the last 168 hours.  Cardiac Enzymes No results for input(s): CKMB, TROPONINI, MYOGLOBIN in the last 168 hours.  Invalid input(s): CK  Invalid input(s): POCBNP No results for input(s): DDIMER in the last 72 hours. No results for input(s): HGBA1C in the last 72 hours.  Recent Labs  02/18/15 0715  CHOL 189  HDL 70  LDLCALC 111*  TRIG 39  CHOLHDL 2.7   No results for input(s): TSH, T4TOTAL, T3FREE, THYROIDAB in  the last 72 hours.  Invalid input(s): FREET3 No results for input(s): VITAMINB12, FOLATE, FERRITIN, TIBC, IRON, RETICCTPCT in the last 72 hours. No results for input(s): LIPASE, AMYLASE in the last 72 hours.  Urine Studies No results for input(s): UHGB, CRYS in the last 72 hours.  Invalid input(s): UACOL, UAPR, USPG, UPH, UTP, UGL, UKET, UBIL, UNIT, UROB, ULEU, UEPI, UWBC, URBC, UBAC, CAST, UCOM, BILUA  MICROBIOLOGY: No results found for this or any previous visit (from the past 240 hour(s)).  RADIOLOGY STUDIES/RESULTS: Dg Chest 2 View  02/17/2015   CLINICAL DATA:  Altered level of consciousness today.  EXAM: CHEST  2 VIEW  COMPARISON:  09/30/2007  FINDINGS: Shallow inspiration. Normal heart size and pulmonary vascularity. Calcified and tortuous aorta. Mediastinal contents appear intact. Slight fibrosis or atelectasis in the left lung base. No blunting of costophrenic angles. No pneumothorax. No focal consolidation. Degenerative changes in the spine and shoulders.  IMPRESSION: Fibrosis or atelectasis in the left lung base. No evidence of active pulmonary disease.   Electronically Signed   By: Burman Nieves M.D.   On: 02/17/2015 21:38   Ct Head Wo Contrast  02/17/2015   CLINICAL DATA:  81 year old female with altered mental status.  EXAM: CT HEAD WITHOUT CONTRAST  TECHNIQUE: Contiguous axial images were obtained from the base of the skull through the vertex without intravenous contrast.  COMPARISON:  None.  FINDINGS: There is slight prominence of the ventricles and sulci compatible with age-related volume loss. Mild periventricular and deep white matter hypodensities represent chronic microvascular ischemic changes. There is no intracranial hemorrhage. No mass effect or midline shift identified.  The visualized paranasal sinuses and mastoid air cells are well aerated. The calvarium is intact.  IMPRESSION: No acute intracranial pathology.  Mild age-related atrophy and chronic microvascular  ischemic disease.  If symptoms persist and there are no contraindications, MRI may provide better evaluation if clinically indicated   Electronically Signed   By: Elgie Collard M.D.   On: 02/17/2015 21:57   Mr Brain Wo Contrast  02/18/2015   CLINICAL DATA:  81 year old female with history of hypertension new present in with acute aphasia, confusion. Symptoms now resolved.  EXAM: MRI HEAD WITHOUT CONTRAST  MRA HEAD WITHOUT CONTRAST  TECHNIQUE: Multiplanar, multiecho pulse sequences of the brain and surrounding structures were obtained without intravenous contrast. Angiographic images of the head were obtained using MRA technique without contrast.  COMPARISON:  Prior CT from 02/17/2015.  FINDINGS: MRI HEAD FINDINGS  Diffuse prominence of the CSF containing spaces is compatible with generalized age-related cerebral atrophy. Patchy and confluent T2/FLAIR hyperintensity within the periventricular and deep white matter of both cerebral hemispheres most consistent with chronic small vessel ischemic disease, fairly  mild for patient age. No areas of chronic infarction identified.  There are patchy multi focal small ischemic infiltrates involving the periatrial white matter adjacent to the left lateral ventricle as well as within the overlying cortical gray matter and subcortical white matter of the posterior left frontal and parietal lobes (series 4, image 24, 26, 28 on axial sequence, series 11, image 11, 8 on coronal sequence). No associated hemorrhage or mass effect. Absent flow void within the distal vertebral artery on the right. Intracranial flow voids otherwise maintained. No acute or chronic intracranial hemorrhage.  No mass lesion, midline shift, or mass effect. Ventricular prominence related to global parenchymal volume loss present without hydrocephalus. No extra-axial fluid collection.  Craniocervical junction is widely patent. Multilevel degenerative spondylolysis noted within the visualized upper cervical  spine.  Pituitary gland normal.  No acute abnormality about the orbits. Sequelae of prior lens extraction noted on the left. There is a T2 hyperintense, mildly FLAIR hyperintense 9 mm lesion at the posterior aspect of the right globe in the region of the optic disc (series 6, image 11), indeterminate.  Mild scattered mucosal thickening within the ethmoidal air cells. Paranasal sinuses are otherwise clear. Mild opacity within the inferior mastoid air cells, right greater than left. Inner ear structures grossly normal.  Bone marrow signal intensity within normal limits. Scalp soft tissues unremarkable.  MRA HEAD FINDINGS  ANTERIOR CIRCULATION:  Visualized distal cervical segments of the internal carotid arteries are widely patent with antegrade flow. Petrous segments well opacified bilaterally. Multi focal atheromatous irregularity present within the cavernous and supraclinoid segments. There is focal severe stenosis of the supraclinoid right ICA at the right ICA terminus (series 5, image 58). Additional focal moderate to severe stenosis within the supraclinoid left ICA (series 5, image 62).  Atheromatous irregularity with mild narrowing within the left A1 segment. Right A1 segment well opacified. Anterior communicating artery normal. Anterior cerebral arteries well opacified to their distal aspects.  Short segment severe stenosis within the proximal left M1 segment (series 501, image 13). Additional short-segment moderate to severe stenosis within the proximal right M1 segment (series 501, image 13). M1 segments well opacified distally. Distal branch atheromatous irregularity within the MCA branches bilaterally.  POSTERIOR CIRCULATION:  Left vertebral artery demonstrates mild atheromatous irregularity but is patent to the vertebrobasilar junction. Right vertebral artery not visualized, likely occluded. Left posterior inferior cerebellar artery patent. Right posterior inferior cerebral artery not well visualized.  Basilar artery widely patent. Superior cerebellar arteries patent proximally. Both the posterior cerebral arteries arise from the basilar artery. There is a short segment severe stenosis within the proximal left P2 segment (series 506, image 12). Left PCA is opacified distally. Mild narrowing of the right P1 segment with distal atheromatous irregularity. Right PCA is opacified to its distal aspect.  No aneurysm or vascular malformation.  IMPRESSION: MRI HEAD IMPRESSION:  1. Small patchy multi focal acute ischemic infarcts within the left periatrial white matter and overlying cortical gray matter and subcortical white matter of the posterior left frontal lobe and left parietal lobe. No associated hemorrhage or mass effect. 2. Generalized cerebral atrophy with mild chronic small vessel ischemic disease. 3. 9 mm cystic lesion at the posterior aspect of the right globe in the region of the macula, likely a small macular cyst related to macular degeneration. Correlation with physical exam and direct visualization as clinically desired.  MRA HEAD IMPRESSION:  1. No acute large or proximal arterial branch occlusion identified within the intracranial circulation. 2. Nonvisualization  of the right vertebral artery, likely occluded. Left vertebral artery widely patent to the vertebrobasilar junction. 3. Focal short segment severe stenosis within the proximal left M1 segment, with a slightly less severe short-segment moderate to severe stenosis within the proximal right M1 segment. 4. Short segment severe stenosis within the proximal left P2 segment without occlusion. 5. Atheromatous irregularity with severe stenosis at the supraclinoid right ICA, with similar but slightly less severe stenosis at the supraclinoid left ICA.   Electronically Signed   By: Rise Mu M.D.   On: 02/18/2015 08:24   Mr Maxine Glenn Head/brain Wo Cm  02/18/2015   CLINICAL DATA:  81 year old female with history of hypertension new present in with  acute aphasia, confusion. Symptoms now resolved.  EXAM: MRI HEAD WITHOUT CONTRAST  MRA HEAD WITHOUT CONTRAST  TECHNIQUE: Multiplanar, multiecho pulse sequences of the brain and surrounding structures were obtained without intravenous contrast. Angiographic images of the head were obtained using MRA technique without contrast.  COMPARISON:  Prior CT from 02/17/2015.  FINDINGS: MRI HEAD FINDINGS  Diffuse prominence of the CSF containing spaces is compatible with generalized age-related cerebral atrophy. Patchy and confluent T2/FLAIR hyperintensity within the periventricular and deep white matter of both cerebral hemispheres most consistent with chronic small vessel ischemic disease, fairly mild for patient age. No areas of chronic infarction identified.  There are patchy multi focal small ischemic infiltrates involving the periatrial white matter adjacent to the left lateral ventricle as well as within the overlying cortical gray matter and subcortical white matter of the posterior left frontal and parietal lobes (series 4, image 24, 26, 28 on axial sequence, series 11, image 11, 8 on coronal sequence). No associated hemorrhage or mass effect. Absent flow void within the distal vertebral artery on the right. Intracranial flow voids otherwise maintained. No acute or chronic intracranial hemorrhage.  No mass lesion, midline shift, or mass effect. Ventricular prominence related to global parenchymal volume loss present without hydrocephalus. No extra-axial fluid collection.  Craniocervical junction is widely patent. Multilevel degenerative spondylolysis noted within the visualized upper cervical spine.  Pituitary gland normal.  No acute abnormality about the orbits. Sequelae of prior lens extraction noted on the left. There is a T2 hyperintense, mildly FLAIR hyperintense 9 mm lesion at the posterior aspect of the right globe in the region of the optic disc (series 6, image 11), indeterminate.  Mild scattered mucosal  thickening within the ethmoidal air cells. Paranasal sinuses are otherwise clear. Mild opacity within the inferior mastoid air cells, right greater than left. Inner ear structures grossly normal.  Bone marrow signal intensity within normal limits. Scalp soft tissues unremarkable.  MRA HEAD FINDINGS  ANTERIOR CIRCULATION:  Visualized distal cervical segments of the internal carotid arteries are widely patent with antegrade flow. Petrous segments well opacified bilaterally. Multi focal atheromatous irregularity present within the cavernous and supraclinoid segments. There is focal severe stenosis of the supraclinoid right ICA at the right ICA terminus (series 5, image 58). Additional focal moderate to severe stenosis within the supraclinoid left ICA (series 5, image 62).  Atheromatous irregularity with mild narrowing within the left A1 segment. Right A1 segment well opacified. Anterior communicating artery normal. Anterior cerebral arteries well opacified to their distal aspects.  Short segment severe stenosis within the proximal left M1 segment (series 501, image 13). Additional short-segment moderate to severe stenosis within the proximal right M1 segment (series 501, image 13). M1 segments well opacified distally. Distal branch atheromatous irregularity within the MCA branches bilaterally.  POSTERIOR  CIRCULATION:  Left vertebral artery demonstrates mild atheromatous irregularity but is patent to the vertebrobasilar junction. Right vertebral artery not visualized, likely occluded. Left posterior inferior cerebellar artery patent. Right posterior inferior cerebral artery not well visualized. Basilar artery widely patent. Superior cerebellar arteries patent proximally. Both the posterior cerebral arteries arise from the basilar artery. There is a short segment severe stenosis within the proximal left P2 segment (series 506, image 12). Left PCA is opacified distally. Mild narrowing of the right P1 segment with distal  atheromatous irregularity. Right PCA is opacified to its distal aspect.  No aneurysm or vascular malformation.  IMPRESSION: MRI HEAD IMPRESSION:  1. Small patchy multi focal acute ischemic infarcts within the left periatrial white matter and overlying cortical gray matter and subcortical white matter of the posterior left frontal lobe and left parietal lobe. No associated hemorrhage or mass effect. 2. Generalized cerebral atrophy with mild chronic small vessel ischemic disease. 3. 9 mm cystic lesion at the posterior aspect of the right globe in the region of the macula, likely a small macular cyst related to macular degeneration. Correlation with physical exam and direct visualization as clinically desired.  MRA HEAD IMPRESSION:  1. No acute large or proximal arterial branch occlusion identified within the intracranial circulation. 2. Nonvisualization of the right vertebral artery, likely occluded. Left vertebral artery widely patent to the vertebrobasilar junction. 3. Focal short segment severe stenosis within the proximal left M1 segment, with a slightly less severe short-segment moderate to severe stenosis within the proximal right M1 segment. 4. Short segment severe stenosis within the proximal left P2 segment without occlusion. 5. Atheromatous irregularity with severe stenosis at the supraclinoid right ICA, with similar but slightly less severe stenosis at the supraclinoid left ICA.   Electronically Signed   By: Rise Mu M.D.   On: 02/18/2015 08:24    Jeoffrey Massed, MD  [facility] Pager:[phone]  If 7PM-7AM, please contact night-coverage [email or web address] Password [email or web address] 02/18/2015, 2:45 PM   LOS: 0 days

## 2015-02-19 ENCOUNTER — Other Ambulatory Visit: Payer: Self-pay | Admitting: *Deleted

## 2015-02-19 DIAGNOSIS — N183 Chronic kidney disease, stage 3 (moderate): Secondary | ICD-10-CM

## 2015-02-19 DIAGNOSIS — E785 Hyperlipidemia, unspecified: Secondary | ICD-10-CM

## 2015-02-19 DIAGNOSIS — I6523 Occlusion and stenosis of bilateral carotid arteries: Secondary | ICD-10-CM

## 2015-02-19 DIAGNOSIS — I6350 Cerebral infarction due to unspecified occlusion or stenosis of unspecified cerebral artery: Secondary | ICD-10-CM

## 2015-02-19 DIAGNOSIS — I10 Essential (primary) hypertension: Secondary | ICD-10-CM

## 2015-02-19 DIAGNOSIS — I6522 Occlusion and stenosis of left carotid artery: Secondary | ICD-10-CM

## 2015-02-19 DIAGNOSIS — R001 Bradycardia, unspecified: Secondary | ICD-10-CM

## 2015-02-19 DIAGNOSIS — I6529 Occlusion and stenosis of unspecified carotid artery: Secondary | ICD-10-CM | POA: Insufficient documentation

## 2015-02-19 LAB — BASIC METABOLIC PANEL
Anion gap: 9 (ref 5–15)
BUN: 26 mg/dL — AB (ref 6–20)
CHLORIDE: 106 mmol/L (ref 101–111)
CO2: 24 mmol/L (ref 22–32)
Calcium: 9 mg/dL (ref 8.9–10.3)
Creatinine, Ser: 1.17 mg/dL — ABNORMAL HIGH (ref 0.44–1.00)
GFR calc Af Amer: 45 mL/min — ABNORMAL LOW (ref >60)
GFR calc non Af Amer: 39 mL/min — ABNORMAL LOW (ref >60)
GLUCOSE: 80 mg/dL (ref 65–99)
POTASSIUM: 4.1 mmol/L (ref 3.5–5.1)
SODIUM: 139 mmol/L (ref 135–145)

## 2015-02-19 LAB — HEMOGLOBIN A1C
Hgb A1c MFr Bld: 5.3 % (ref 4.8–5.6)
MEAN PLASMA GLUCOSE: 105 mg/dL

## 2015-02-19 MED ORDER — CLOPIDOGREL BISULFATE 75 MG PO TABS: 75.0000 mg | ORAL_TABLET | Freq: Every day | ORAL | Status: DC

## 2015-02-19 MED ORDER — SIMVASTATIN 20 MG PO TABS: 20.0000 mg | ORAL_TABLET | Freq: Every day | ORAL | Status: DC

## 2015-02-19 MED ORDER — HYDRALAZINE HCL 25 MG PO TABS: 25.0000 mg | ORAL_TABLET | Freq: Two times a day (BID) | ORAL | Status: DC

## 2015-02-19 NOTE — Progress Notes (Signed)
Physical Therapy Treatment Patient Details Name: Patricia [name] MRN: [id number] DOB: 1918/09/12 Today's Date: 02/19/2015    History of Present Illness 81 y.o. female admitted for acute ischemic stroke. MRI brain shows patchy multifocal acute ischemic infarct in the left frontal/parietal lobe. MRA brain shows diffuse cerebrovascular disease.    PT Comments    Pt con't to have mild cognitive deficits in conjunction with balance impairment and decreased insight to deficits and safety. Sister aware pt is unable to return home alone After speaking with pt and sister, pt has agreed to SNF as she is unsafe to return home alone and is at a high risk of falling. Pt to benefit from ST-SNF stay to achive supervision level of function for safe transition home with sister.   Follow Up Recommendations  SNF;Supervision/Assistance - 24 hour     Equipment Recommendations  None recommended by PT    Recommendations for Other Services Speech consult     Precautions / Restrictions Precautions Precautions: Fall Precaution Comments: poor vision Restrictions Weight Bearing Restrictions: No    Mobility  Bed Mobility Overal bed mobility: Needs Assistance Bed Mobility: Supine to Sit     Supine to sit: Supervision     General bed mobility comments: increaed time  Transfers Overall transfer level: Needs assistance Equipment used: Straight cane Transfers: Sit to/from Stand;Stand Pivot Transfers Sit to Stand: Min guard Stand pivot transfers: Min assist       General transfer comment: min A with turns due to LOB   Ambulation/Gait Ambulation/Gait assistance: Min assist Ambulation Distance (Feet): 80 Feet Assistive device: Straight cane Gait Pattern/deviations: Step-through pattern Gait velocity: decreaed   General Gait Details: pt with freq crossover gait pattern/instability this date requiring minA to maintain balance. pt required v/c's for sequencing with cane   Stairs Stairs:  Yes Stairs assistance: Min assist Stair Management: One rail Left;With cane;Alternating pattern Number of Stairs: 3 General stair comments: minA for stability, max v/c's for safety and sequencing with cane  Wheelchair Mobility    Modified Rankin (Stroke Patients Only) Modified Rankin (Stroke Patients Only) Pre-Morbid Rankin Score: Slight disability Modified Rankin: Moderately severe disability     Balance Overall balance assessment: Needs assistance Sitting-balance support: Feet supported Sitting balance-Leahy Scale: Fair     Standing balance support: During functional activity;Single extremity supported Standing balance-Leahy Scale: Fair                      Cognition Arousal/Alertness: Awake/alert Behavior During Therapy: WFL for tasks assessed/performed Overall Cognitive Status: Within Functional Limits for tasks assessed (for basic tasks )           Safety/Judgement: Decreased awareness of deficits   Problem Solving: Slow processing;Difficulty sequencing General Comments: pt with delayed response, impaired comprehension, difficulty processing, decreased insight to deficits and safety awarenss    Exercises      General Comments General comments (skin integrity, edema, etc.): pt with severe vision impairment furthering pt's balance impairment.      Pertinent Vitals/Pain Pain Assessment: No/denies pain    Home Living Family/patient expects to be discharged to:: Skilled nursing facility Living Arrangements: Alone Available Help at Discharge: Family Type of Home: Apartment Home Access: Stairs to enter Entrance Stairs-Rails: Left   Home Equipment: Gilmer Mor - single point;Walker - 2 wheels      Prior Function Level of Independence: Independent with assistive device(s)      Comments: uses SPC for ambulation.  Does not drive    PT Goals (current goals  can now be found in the care plan section) Acute Rehab PT Goals Patient Stated Goal: to regain  independence  Progress towards PT goals: Progressing toward goals    Frequency  Min 4X/week    PT Plan Current plan remains appropriate    Co-evaluation             End of Session Equipment Utilized During Treatment: Gait belt Activity Tolerance: Patient tolerated treatment well Patient left: in chair;with call bell/phone within reach;with chair alarm set;with family/visitor present     Time: 1125-1150 PT Time Calculation (min) (ACUTE ONLY): 25 min  Charges:  $Gait Training: 23-37 mins                    G Codes:      Marcene Brawn 02/19/2015, 2:12 PM   Lewis Shock, PT, DPT Pager #: [phone] Office #: [phone]

## 2015-02-19 NOTE — Progress Notes (Signed)
Patient discharged to SNF, PTAR transporting IV removed no complaint of pain, she is alert sisters will meet her their.

## 2015-02-19 NOTE — Discharge Summary (Addendum)
PATIENT DETAILS Name: Patricia [name] Age: 81 y.o. Sex: female Date of Birth: 25-Apr-1919 MRN: [id number]. Admitting Physician: Hillary Bow, DO WUJ:WJXB Clovis Riley, MD  Admit Date: 02/17/2015 Discharge date: 02/19/2015  Recommendations for Outpatient Follow-up:  1. Aspirin and Plavix for 3 months, following then Plavix alone.  2. Please check A1c and lipid panel in 3 months  3. Please ensure she'll follow-up with vascular surgery-Dr. fields.   PRIMARY DISCHARGE DIAGNOSIS:  Principal Problem:   TIA (transient ischemic attack) Active Problems:   HTN (hypertension)   Acute ischemic stroke      PAST MEDICAL HISTORY: Past Medical History  Diagnosis Date  . HTN (hypertension)   . Renal insufficiency     a. Based on labs available in Epic.  Marland Kitchen Palpitations     a. Event monitor 11/2013: rare atrial ectopy, brief atrial runs (longest 1.25seconds), SVE complexes represent 0.82% of total beats, no atrial fibirillation seen, wandering pacemaker, slowest HR 40bpm.  . Cataract     DISCHARGE MEDICATIONS: Current Discharge Medication List    START taking these medications   Details  clopidogrel (PLAVIX) 75 MG tablet Take 1 tablet (75 mg total) by mouth daily.    hydrALAZINE (APRESOLINE) 25 MG tablet Take 1 tablet (25 mg total) by mouth 2 (two) times daily.    simvastatin (ZOCOR) 20 MG tablet Take 1 tablet (20 mg total) by mouth daily at 6 PM.      CONTINUE these medications which have NOT CHANGED   Details  acetaminophen (TYLENOL) 325 MG tablet Take 650 mg by mouth every 6 (six) hours as needed for pain or fever.    amLODipine-benazepril (LOTREL) 10-20 MG per capsule Take 1 capsule by mouth daily.    aspirin 81 MG chewable tablet Chew 81 mg by mouth daily.    hydrochlorothiazide (HYDRODIURIL) 25 MG tablet Take 1 tablet by mouth daily. Refills: 1    polyethylene glycol powder (GLYCOLAX/MIRALAX) powder Take 17 g by mouth 2 (two) times daily. Until daily soft stools Qty: 250 g,  Refills: 0    pramoxine (PROCTOFOAM) 1 % foam Place 1 application rectally 3 (three) times daily as needed for itching. Qty: 15 g, Refills: 0      STOP taking these medications     metoprolol (LOPRESSOR) 50 MG tablet         ALLERGIES:  No Known Allergies  BRIEF HPI:  See H&P, Labs, Consult and Test reports for all details in brief, patient was admitted for evaluation of aphasia.  CONSULTATIONS:   neurology and vascular surgery  PERTINENT RADIOLOGIC STUDIES: Dg Chest 2 View  02/17/2015   CLINICAL DATA:  Altered level of consciousness today.  EXAM: CHEST  2 VIEW  COMPARISON:  09/30/2007  FINDINGS: Shallow inspiration. Normal heart size and pulmonary vascularity. Calcified and tortuous aorta. Mediastinal contents appear intact. Slight fibrosis or atelectasis in the left lung base. No blunting of costophrenic angles. No pneumothorax. No focal consolidation. Degenerative changes in the spine and shoulders.  IMPRESSION: Fibrosis or atelectasis in the left lung base. No evidence of active pulmonary disease.   Electronically Signed   By: Burman Nieves M.D.   On: 02/17/2015 21:38   Ct Head Wo Contrast  02/17/2015   CLINICAL DATA:  81 year old female with altered mental status.  EXAM: CT HEAD WITHOUT CONTRAST  TECHNIQUE: Contiguous axial images were obtained from the base of the skull through the vertex without intravenous contrast.  COMPARISON:  None.  FINDINGS: There is slight prominence of the  ventricles and sulci compatible with age-related volume loss. Mild periventricular and deep white matter hypodensities represent chronic microvascular ischemic changes. There is no intracranial hemorrhage. No mass effect or midline shift identified.  The visualized paranasal sinuses and mastoid air cells are well aerated. The calvarium is intact.  IMPRESSION: No acute intracranial pathology.  Mild age-related atrophy and chronic microvascular ischemic disease.  If symptoms persist and there are no  contraindications, MRI may provide better evaluation if clinically indicated   Electronically Signed   By: Elgie Collard M.D.   On: 02/17/2015 21:57   Mr Brain Wo Contrast  02/18/2015   CLINICAL DATA:  81 year old female with history of hypertension new present in with acute aphasia, confusion. Symptoms now resolved.  EXAM: MRI HEAD WITHOUT CONTRAST  MRA HEAD WITHOUT CONTRAST  TECHNIQUE: Multiplanar, multiecho pulse sequences of the brain and surrounding structures were obtained without intravenous contrast. Angiographic images of the head were obtained using MRA technique without contrast.  COMPARISON:  Prior CT from 02/17/2015.  FINDINGS: MRI HEAD FINDINGS  Diffuse prominence of the CSF containing spaces is compatible with generalized age-related cerebral atrophy. Patchy and confluent T2/FLAIR hyperintensity within the periventricular and deep white matter of both cerebral hemispheres most consistent with chronic small vessel ischemic disease, fairly mild for patient age. No areas of chronic infarction identified.  There are patchy multi focal small ischemic infiltrates involving the periatrial white matter adjacent to the left lateral ventricle as well as within the overlying cortical gray matter and subcortical white matter of the posterior left frontal and parietal lobes (series 4, image 24, 26, 28 on axial sequence, series 11, image 11, 8 on coronal sequence). No associated hemorrhage or mass effect. Absent flow void within the distal vertebral artery on the right. Intracranial flow voids otherwise maintained. No acute or chronic intracranial hemorrhage.  No mass lesion, midline shift, or mass effect. Ventricular prominence related to global parenchymal volume loss present without hydrocephalus. No extra-axial fluid collection.  Craniocervical junction is widely patent. Multilevel degenerative spondylolysis noted within the visualized upper cervical spine.  Pituitary gland normal.  No acute abnormality  about the orbits. Sequelae of prior lens extraction noted on the left. There is a T2 hyperintense, mildly FLAIR hyperintense 9 mm lesion at the posterior aspect of the right globe in the region of the optic disc (series 6, image 11), indeterminate.  Mild scattered mucosal thickening within the ethmoidal air cells. Paranasal sinuses are otherwise clear. Mild opacity within the inferior mastoid air cells, right greater than left. Inner ear structures grossly normal.  Bone marrow signal intensity within normal limits. Scalp soft tissues unremarkable.  MRA HEAD FINDINGS  ANTERIOR CIRCULATION:  Visualized distal cervical segments of the internal carotid arteries are widely patent with antegrade flow. Petrous segments well opacified bilaterally. Multi focal atheromatous irregularity present within the cavernous and supraclinoid segments. There is focal severe stenosis of the supraclinoid right ICA at the right ICA terminus (series 5, image 58). Additional focal moderate to severe stenosis within the supraclinoid left ICA (series 5, image 62).  Atheromatous irregularity with mild narrowing within the left A1 segment. Right A1 segment well opacified. Anterior communicating artery normal. Anterior cerebral arteries well opacified to their distal aspects.  Short segment severe stenosis within the proximal left M1 segment (series 501, image 13). Additional short-segment moderate to severe stenosis within the proximal right M1 segment (series 501, image 13). M1 segments well opacified distally. Distal branch atheromatous irregularity within the MCA branches bilaterally.  POSTERIOR CIRCULATION:  Left vertebral artery demonstrates mild atheromatous irregularity but is patent to the vertebrobasilar junction. Right vertebral artery not visualized, likely occluded. Left posterior inferior cerebellar artery patent. Right posterior inferior cerebral artery not well visualized. Basilar artery widely patent. Superior cerebellar arteries  patent proximally. Both the posterior cerebral arteries arise from the basilar artery. There is a short segment severe stenosis within the proximal left P2 segment (series 506, image 12). Left PCA is opacified distally. Mild narrowing of the right P1 segment with distal atheromatous irregularity. Right PCA is opacified to its distal aspect.  No aneurysm or vascular malformation.  IMPRESSION: MRI HEAD IMPRESSION:  1. Small patchy multi focal acute ischemic infarcts within the left periatrial white matter and overlying cortical gray matter and subcortical white matter of the posterior left frontal lobe and left parietal lobe. No associated hemorrhage or mass effect. 2. Generalized cerebral atrophy with mild chronic small vessel ischemic disease. 3. 9 mm cystic lesion at the posterior aspect of the right globe in the region of the macula, likely a small macular cyst related to macular degeneration. Correlation with physical exam and direct visualization as clinically desired.  MRA HEAD IMPRESSION:  1. No acute large or proximal arterial branch occlusion identified within the intracranial circulation. 2. Nonvisualization of the right vertebral artery, likely occluded. Left vertebral artery widely patent to the vertebrobasilar junction. 3. Focal short segment severe stenosis within the proximal left M1 segment, with a slightly less severe short-segment moderate to severe stenosis within the proximal right M1 segment. 4. Short segment severe stenosis within the proximal left P2 segment without occlusion. 5. Atheromatous irregularity with severe stenosis at the supraclinoid right ICA, with similar but slightly less severe stenosis at the supraclinoid left ICA.   Electronically Signed   By: Rise Mu M.D.   On: 02/18/2015 08:24   Mr Maxine Glenn Head/brain Wo Cm  02/18/2015   CLINICAL DATA:  81 year old female with history of hypertension new present in with acute aphasia, confusion. Symptoms now resolved.  EXAM: MRI  HEAD WITHOUT CONTRAST  MRA HEAD WITHOUT CONTRAST  TECHNIQUE: Multiplanar, multiecho pulse sequences of the brain and surrounding structures were obtained without intravenous contrast. Angiographic images of the head were obtained using MRA technique without contrast.  COMPARISON:  Prior CT from 02/17/2015.  FINDINGS: MRI HEAD FINDINGS  Diffuse prominence of the CSF containing spaces is compatible with generalized age-related cerebral atrophy. Patchy and confluent T2/FLAIR hyperintensity within the periventricular and deep white matter of both cerebral hemispheres most consistent with chronic small vessel ischemic disease, fairly mild for patient age. No areas of chronic infarction identified.  There are patchy multi focal small ischemic infiltrates involving the periatrial white matter adjacent to the left lateral ventricle as well as within the overlying cortical gray matter and subcortical white matter of the posterior left frontal and parietal lobes (series 4, image 24, 26, 28 on axial sequence, series 11, image 11, 8 on coronal sequence). No associated hemorrhage or mass effect. Absent flow void within the distal vertebral artery on the right. Intracranial flow voids otherwise maintained. No acute or chronic intracranial hemorrhage.  No mass lesion, midline shift, or mass effect. Ventricular prominence related to global parenchymal volume loss present without hydrocephalus. No extra-axial fluid collection.  Craniocervical junction is widely patent. Multilevel degenerative spondylolysis noted within the visualized upper cervical spine.  Pituitary gland normal.  No acute abnormality about the orbits. Sequelae of prior lens extraction noted on the left. There is a T2 hyperintense, mildly FLAIR hyperintense  9 mm lesion at the posterior aspect of the right globe in the region of the optic disc (series 6, image 11), indeterminate.  Mild scattered mucosal thickening within the ethmoidal air cells. Paranasal sinuses are  otherwise clear. Mild opacity within the inferior mastoid air cells, right greater than left. Inner ear structures grossly normal.  Bone marrow signal intensity within normal limits. Scalp soft tissues unremarkable.  MRA HEAD FINDINGS  ANTERIOR CIRCULATION:  Visualized distal cervical segments of the internal carotid arteries are widely patent with antegrade flow. Petrous segments well opacified bilaterally. Multi focal atheromatous irregularity present within the cavernous and supraclinoid segments. There is focal severe stenosis of the supraclinoid right ICA at the right ICA terminus (series 5, image 58). Additional focal moderate to severe stenosis within the supraclinoid left ICA (series 5, image 62).  Atheromatous irregularity with mild narrowing within the left A1 segment. Right A1 segment well opacified. Anterior communicating artery normal. Anterior cerebral arteries well opacified to their distal aspects.  Short segment severe stenosis within the proximal left M1 segment (series 501, image 13). Additional short-segment moderate to severe stenosis within the proximal right M1 segment (series 501, image 13). M1 segments well opacified distally. Distal branch atheromatous irregularity within the MCA branches bilaterally.  POSTERIOR CIRCULATION:  Left vertebral artery demonstrates mild atheromatous irregularity but is patent to the vertebrobasilar junction. Right vertebral artery not visualized, likely occluded. Left posterior inferior cerebellar artery patent. Right posterior inferior cerebral artery not well visualized. Basilar artery widely patent. Superior cerebellar arteries patent proximally. Both the posterior cerebral arteries arise from the basilar artery. There is a short segment severe stenosis within the proximal left P2 segment (series 506, image 12). Left PCA is opacified distally. Mild narrowing of the right P1 segment with distal atheromatous irregularity. Right PCA is opacified to its distal  aspect.  No aneurysm or vascular malformation.  IMPRESSION: MRI HEAD IMPRESSION:  1. Small patchy multi focal acute ischemic infarcts within the left periatrial white matter and overlying cortical gray matter and subcortical white matter of the posterior left frontal lobe and left parietal lobe. No associated hemorrhage or mass effect. 2. Generalized cerebral atrophy with mild chronic small vessel ischemic disease. 3. 9 mm cystic lesion at the posterior aspect of the right globe in the region of the macula, likely a small macular cyst related to macular degeneration. Correlation with physical exam and direct visualization as clinically desired.  MRA HEAD IMPRESSION:  1. No acute large or proximal arterial branch occlusion identified within the intracranial circulation. 2. Nonvisualization of the right vertebral artery, likely occluded. Left vertebral artery widely patent to the vertebrobasilar junction. 3. Focal short segment severe stenosis within the proximal left M1 segment, with a slightly less severe short-segment moderate to severe stenosis within the proximal right M1 segment. 4. Short segment severe stenosis within the proximal left P2 segment without occlusion. 5. Atheromatous irregularity with severe stenosis at the supraclinoid right ICA, with similar but slightly less severe stenosis at the supraclinoid left ICA.   Electronically Signed   By: Rise Mu M.D.   On: 02/18/2015 08:24     PERTINENT LAB RESULTS: CBC:  Recent Labs  02/17/15 2112  WBC 6.1  HGB 10.5*  HCT 32.6*  PLT 178   CMET CMP     Component Value Date/Time   NA 139 02/19/2015 0532   K 4.1 02/19/2015 0532   CL 106 02/19/2015 0532   CO2 24 02/19/2015 0532   GLUCOSE 80 02/19/2015 0532  BUN 26* 02/19/2015 0532   CREATININE 1.17* 02/19/2015 0532   CREATININE 1.22* 01/09/2014 1628   CALCIUM 9.0 02/19/2015 0532   PROT 7.0 02/17/2015 2112   ALBUMIN 3.6 02/17/2015 2112   AST 21 02/17/2015 2112   ALT 11*  02/17/2015 2112   ALKPHOS 72 02/17/2015 2112   BILITOT 0.3 02/17/2015 2112   GFRNONAA 39* 02/19/2015 0532   GFRAA 45* 02/19/2015 0532    GFR Estimated Creatinine Clearance: 28.6 mL/min (by C-G formula based on Cr of 1.17). No results for input(s): LIPASE, AMYLASE in the last 72 hours. No results for input(s): CKTOTAL, CKMB, CKMBINDEX, TROPONINI in the last 72 hours. Invalid input(s): POCBNP No results for input(s): DDIMER in the last 72 hours.  Recent Labs  02/18/15 0715  HGBA1C 5.3    Recent Labs  02/18/15 0715  CHOL 189  HDL 70  LDLCALC 111*  TRIG 39  CHOLHDL 2.7    Recent Labs  02/18/15 1827  TSH 2.234   No results for input(s): VITAMINB12, FOLATE, FERRITIN, TIBC, IRON, RETICCTPCT in the last 72 hours. Coags: No results for input(s): INR in the last 72 hours.  Invalid input(s): PT Microbiology: No results found for this or any previous visit (from the past 240 hour(s)).   BRIEF HOSPITAL COURSE:  Acute ischemic stroke: Speech improved. MRI brain shows patchy multifocal acute ischemic infarct in the left frontal/parietal lobe. MRA brain shows diffuse cerebrovascular disease. LDL 111 (goal <70)-started statin. A1c 5.3. Spoke with neurology-recommendations to start aspirin and Plavix for 3 months and then following that-Plavix alone. Echocardiogram WNL (EF: 60-65% with no wall motion abnormalities). Carotid show 40-59% R internal carotid stenosis with 1-39% L internal carotid stenosis. Seen by vascular surgery, plans are for outpatient follow-up with vascular surgery in 2 weeks for further care. PT recommends SNF. Plan is for SNF.   Dyslipidemia: LDL 111 (goal <70)-started statin. No issues thus far.   HTN (hypertension): Blood pressure continues to fluctuate, but remains at an appropriate level-continue amlodipine, benazepril and HCTZ. Will add Bradycardia still present- continue to hold metoprolol. Monitor and adjust accordingly.   Sinus Bradycardia: Continue to  hold metoprolol. TSH WNL. Asymptomatic.  Chronic kidney disease stage III: Creatinine close to usual baseline. Monitor closely.  Moderate to severe pulmonary hypertension: Follow up with cardiology. Likely a chronic issue.  TODAY-DAY OF DISCHARGE:  Subjective:   Patricia [name] today has no headache,no chest abdominal pain,no new weakness tingling or numbness, feels much better   Objective:   Blood pressure 161/40, pulse 57, temperature 98.2 F (36.8 C), temperature source Oral, resp. rate 15, height 5\' 7"  (1.702 m), weight 64 kg (141 lb 1.5 oz), SpO2 100 %. No intake or output data in the 24 hours ending 02/19/15 1427 Filed Weights   02/18/15 0045 02/18/15 1400  Weight: 64.275 kg (141 lb 11.2 oz) 64 kg (141 lb 1.5 oz)    Exam Awake Alert, Oriented *3, No new F.N deficits, Normal affect [place].AT,PERRAL Supple Neck,No JVD, No cervical lymphadenopathy appriciated.  Symmetrical Chest wall movement, Good air movement bilaterally, CTAB RRR,No Gallops,Rubs or new Murmurs, No Parasternal Heave +ve B.Sounds, Abd Soft, Non tender, No organomegaly appriciated, No rebound -guarding or rigidity. No Cyanosis, Clubbing or edema, No new Rash or bruise  DISCHARGE CONDITION: Stable  DISPOSITION: SNF  DISCHARGE INSTRUCTIONS:    Activity:  As tolerated with Full fall precautions use walker/cane & assistance as needed  Get Medicines reviewed and adjusted: Please take all your medications with you for your next  visit with your Primary MD  Please request your Primary MD to go over all hospital tests and procedure/radiological results at the follow up, please ask your Primary MD to get all Hospital records sent to his/her office.  If you experience worsening of your admission symptoms, develop shortness of breath, life threatening emergency, suicidal or homicidal thoughts you must seek medical attention immediately by calling 911 or calling your MD immediately  if symptoms less severe.  You must  read complete instructions/literature along with all the possible adverse reactions/side effects for all the Medicines you take and that have been prescribed to you. Take any new Medicines after you have completely understood and accpet all the possible adverse reactions/side effects.   Do not drive when taking Pain medications.   Do not take more than prescribed Pain, Sleep and Anxiety Medications  Special Instructions: If you have smoked or chewed Tobacco  in the last 2 yrs please stop smoking, stop any regular Alcohol  and or any Recreational drug use.  Wear Seat belts while driving.  Please note  You were cared for by a hospitalist during your hospital stay. Once you are discharged, your primary care physician will handle any further medical issues. Please note that NO REFILLS for any discharge medications will be authorized once you are discharged, as it is imperative that you return to your primary care physician (or establish a relationship with a primary care physician if you do not have one) for your aftercare needs so that they can reassess your need for medications and monitor your lab values.   Diet recommendation: Diabetic Diet Heart Healthy diet  Discharge Instructions    Diet - low sodium heart healthy    Complete by:  As directed      Increase activity slowly    Complete by:  As directed            Follow-up Information    Follow up with Lupe Carney, MD. Schedule an appointment as soon as possible for a visit in 2 weeks.   Specialty:  Family Medicine   Contact information:   [address] [place] [address] [phone]       Follow up with Fabienne Bruns, MD. Schedule an appointment as soon as possible for a visit in 2 weeks.   Specialties:  Vascular Surgery, Cardiology   Contact information:   [address] [place] [address] [phone]       Follow up with Xu,Jindong, MD. Schedule an appointment as soon as possible for a visit in  2 weeks.   Specialty:  Neurology   Contact information:   [address] [place] [address] [phone]      Total Time spent on discharge equals 45 minutes.  SignedJeoffrey Massed 02/19/2015 2:27 PM

## 2015-02-19 NOTE — Clinical Social Work Placement (Signed)
   CLINICAL SOCIAL WORK PLACEMENT  NOTE  Date:  02/19/2015  Patient Details  Name: Patricia [name] MRN: [id number] Date of Birth: [birth year]  Clinical Social Work is seeking post-discharge placement for this patient at the Skilled  Nursing Facility level of care (*CSW will initial, date and re-position this form in  chart as items are completed):  Yes   Patient/family provided with [facility] Social Work Department's list of facilities offering this level of care within the geographic area requested by the patient (or if unable, by the patient's family).  Yes   Patient/family informed of their freedom to choose among providers that offer the needed level of care, that participate in Medicare, Medicaid or managed care program needed by the patient, have an available bed and are willing to accept the patient.  Yes   Patient/family informed of [facility]'s ownership interest in [facility] and [facility], as well as of the fact that they are under no obligation to receive care at these facilities.  PASRR submitted to EDS on 02/19/15     PASRR number received on 02/19/15     Existing PASRR number confirmed on       FL2 transmitted to all facilities in geographic area requested by pt/family on       FL2 transmitted to all facilities within larger geographic area on       Patient informed that his/her managed care company has contracts with or will negotiate with certain facilities, including the following:            Patient/family informed of bed offers received.  Patient chooses bed at       Physician recommends and patient chooses bed at      Patient to be transferred to   on  .  Patient to be transferred to facility by       Patient family notified on   of transfer.  Name of family member notified:        PHYSICIAN       Additional Comment:    _______________________________________________ Gwynne Edinger, LCSW 02/19/2015, 12:26 PM

## 2015-02-19 NOTE — Evaluation (Signed)
Occupational Therapy Evaluation Patient Details Name: Patricia [name] MRN: [id number] DOB: [birth year] Today's Date: 02/19/2015    History of Present Illness 81 y.o. female admitted for acute ischemic stroke. MRI brain shows patchy multifocal acute ischemic infarct in the left frontal/parietal lobe. MRA brain shows diffuse cerebrovascular disease.   Clinical Impression   Pt admitted with above. She demonstrates generalized weakness and impaired balance.  She requires min A overall for ADLs.  Recommend SNF level rehab at discharge.  All further needs can be met at SNF, will defer further OT.     Follow Up Recommendations  SNF;Supervision/Assistance - 24 hour    Equipment Recommendations  None recommended by OT    Recommendations for Other Services       Precautions / Restrictions Precautions Precautions: Fall      Mobility Bed Mobility                  Transfers Overall transfer level: Needs assistance Equipment used: Straight cane Transfers: Sit to/from Stand;Stand Pivot Transfers Sit to Stand: Min guard Stand pivot transfers: Min assist       General transfer comment: min A with turns due to LOB     Balance Overall balance assessment: Needs assistance Sitting-balance support: Feet supported Sitting balance-Leahy Scale: Fair     Standing balance support: During functional activity;Single extremity supported Standing balance-Leahy Scale: Fair                              ADL Overall ADL's : Needs assistance/impaired Eating/Feeding: Independent;Sitting   Grooming: Wash/dry hands;Oral care;Wash/dry face;Min guard;Standing Grooming Details (indicate cue type and reason): Needs assist for applying toothpaste to toothbrush due to low vision  Upper Body Bathing: Supervision/ safety;Sitting   Lower Body Bathing: Minimal assistance;Sit to/from stand   Upper Body Dressing : Set up;Sitting   Lower Body Dressing: Minimal assistance;Sit to/from  stand   Toilet Transfer: Minimal assistance;Ambulation;Regular Glass blower/designer Details (indicate cue type and reason): min A for balance due to low surface  Toileting- Clothing Manipulation and Hygiene: Min guard;Sit to/from stand       Functional mobility during ADLs: Min guard;Minimal assistance;Cane       Vision Additional Comments: Pt reports vision with no changes.  She does not have glasses with her. Unable to accurately assess due to not having glasses   Perception Perception Perception Tested?: Yes   Praxis Praxis Praxis tested?: Within functional limits    Pertinent Vitals/Pain Pain Assessment: No/denies pain     Hand Dominance Right   Extremity/Trunk Assessment Upper Extremity Assessment Upper Extremity Assessment: Overall WFL for tasks assessed   Lower Extremity Assessment Lower Extremity Assessment: Defer to PT evaluation       Communication Communication Communication: Expressive difficulties (word finding difficulties )   Cognition Arousal/Alertness: Awake/alert Behavior During Therapy: WFL for tasks assessed/performed Overall Cognitive Status: Within Functional Limits for tasks assessed (for basic tasks )                     General Comments       Exercises       Shoulder Instructions      Home Living Family/patient expects to be discharged to:: Skilled nursing facility Living Arrangements: Alone Available Help at Discharge: Family Type of Home: Apartment Home Access: Stairs to enter Entrance Stairs-Number of Steps: 12 Entrance Stairs-Rails: Left  Home Equipment: Kasandra Knudsen - single point;Walker - 2 wheels          Prior Functioning/Environment Level of Independence: Independent with assistive device(s)        Comments: uses SPC for ambulation.  Does not drive     OT Diagnosis: Generalized weakness   OT Problem List: Decreased strength;Decreased activity tolerance;Impaired balance (sitting and/or  standing);Impaired vision/perception;Decreased knowledge of use of DME or AE   OT Treatment/Interventions: Self-care/ADL training;DME and/or AE instruction;Therapeutic activities;Visual/perceptual remediation/compensation;Patient/family education;Balance training    OT Goals(Current goals can be found in the care plan section) Acute Rehab OT Goals Patient Stated Goal: to regain independence  OT Goal Formulation: All assessment and education complete, DC therapy  OT Frequency: Min 2X/week   Barriers to D/C: Decreased caregiver support          Co-evaluation              End of Session Equipment Utilized During Treatment: Other (comment) (spc) Nurse Communication: Mobility status  Activity Tolerance: Patient tolerated treatment well Patient left: in chair;with call bell/phone within reach;with chair alarm set;with family/visitor present   Time: 4315-4008 OT Time Calculation (min): 24 min Charges:  OT General Charges $OT Visit: 1 Procedure OT Evaluation $Initial OT Evaluation Tier I: 1 Procedure OT Treatments $Self Care/Home Management : 8-22 mins G-Codes:    Patricia [name] M 2015/03/16, 1:12 PM

## 2015-02-19 NOTE — Progress Notes (Addendum)
STROKE TEAM PROGRESS NOTE   SUBJECTIVE (INTERVAL HISTORY) Family members, including niece and sister from [place] who wants her to go home with her, are at the bedside. Pt is doing well, walked with PT in the hallway.    OBJECTIVE Temp:  [98 F (36.7 C)-98.6 F (37 C)] 98.6 F (37 C) (09/12 1001) Pulse Rate:  [46-59] 55 (09/12 1001) Cardiac Rhythm:  [-] Sinus bradycardia (09/12 0801) Resp:  [16-19] 19 (09/12 1001) BP: (150-172)/(35-48) 160/48 mmHg (09/12 1001) SpO2:  [100 %] 100 % (09/12 1001) Weight:  [64 kg (141 lb 1.5 oz)] 64 kg (141 lb 1.5 oz) (09/11 1400)  CBC:   Recent Labs Lab 02/17/15 2112  WBC 6.1  NEUTROABS 3.3  HGB 10.5*  HCT 32.6*  MCV 99.7  PLT 178   Basic Metabolic Panel:   Recent Labs Lab 02/17/15 2112 02/19/15 0532  NA 142 139  K 4.3 4.1  CL 109 106  CO2 25 24  GLUCOSE 120* 80  BUN 40* 26*  CREATININE 1.59* 1.17*  CALCIUM 9.7 9.0   Lipid Panel:     Component Value Date/Time   CHOL 189 02/18/2015 0715   TRIG 39 02/18/2015 0715   HDL 70 02/18/2015 0715   CHOLHDL 2.7 02/18/2015 0715   VLDL 8 02/18/2015 0715   LDLCALC 111* 02/18/2015 0715   HgbA1c:  Lab Results  Component Value Date   HGBA1C 5.3 02/18/2015   Urine Drug Screen: No results found for: LABOPIA, COCAINSCRNUR, LABBENZ, AMPHETMU, THCU, LABBARB    IMAGING  I have personally reviewed the radiological images below and agree with the radiology interpretations.  Dg Chest 2 View 02/17/2015    Fibrosis or atelectasis in the left lung base. No evidence of active pulmonary disease.     Ct Head Wo Contrast 02/17/2015    No acute intracranial pathology.  Mild age-related atrophy and chronic microvascular ischemic disease.  If symptoms persist and there are no contraindications, MRI may provide better evaluation if clinically indicated     MRI HEAD   02/18/2015    1. Small patchy multi focal acute ischemic infarcts within the left periatrial white matter and overlying cortical gray  matter and subcortical white matter of the posterior left frontal lobe and left parietal lobe. No associated hemorrhage or mass effect.  2. Generalized cerebral atrophy with mild chronic small vessel ischemic disease.  3. 9 mm cystic lesion at the posterior aspect of the right globe in the region of the macula, likely a small macular cyst related to macular degeneration. Correlation with physical exam and direct visualization as clinically desired.    MRA HEAD 02/18/2015    1. No acute large or proximal arterial branch occlusion identified within the intracranial circulation.  2. Nonvisualization of the right vertebral artery, likely occluded. Left vertebral artery widely patent to the vertebrobasilar junction.  3. Focal short segment severe stenosis within the proximal left M1 segment, with a slightly less severe short-segment moderate to severe stenosis within the proximal right M1 segment.  4. Short segment severe stenosis within the proximal left P2 segment without occlusion.  5. Atheromatous irregularity with severe stenosis at the supraclinoid right ICA, with similar but slightly less severe stenosis at the supraclinoid left ICA.     Carotid Doppler  Findings suggest upper range 40-59% right internal carotid artery stenosis. Elevated left internal carotid artery systolic velocities and ICA/CCA ratio suggest >80% stenosis, however end diastolic velocities suggest upper range 1-39% stenosis. Bilateral vertebral arteries are patent with  antegrade flow.  2D Echocardiogram  - Left ventricle: The cavity size was normal. Wall thickness wasnormal. Systolic function was normal. The estimated ejectionfraction was in the range of 60% to 65%. Wall motion was normal;there were no regional wall motion abnormalities. Dopplerparameters are consistent with abnormal left ventricularrelaxation (grade 1 diastolic dysfunction). - Aortic valve: There was mild regurgitation. - Mitral valve: There was mild to  moderate regurgitation. - Atrial septum: No defect or patent foramen ovale was identified. - Tricuspid valve: There was moderate regurgitation. - Pulmonic valve: There was moderate regurgitation. - Pulmonary arteries: PA peak pressure: 62 mm Hg (S). Impressions:  No cardiac source of emboli was indentified.   Neurologic Exam Mental Status: Alert, oriented except date, thought content appropriate.  Speech fluent without evidence of aphasia.  Able to follow 3 step commands without difficulty. Cranial Nerves: II: Visual fields grossly normal, pupils equal, round, reactive to light and accommodation. Poor visual acuity. III,IV, VI: ptosis not present, extra-ocular motions intact bilaterally V,VII: smile symmetric, facial light touch sensation normal bilaterally VIII: hearing normal bilaterally IX,X: gag reflex present XI: bilateral shoulder shrug XII: midline tongue extension Motor: Right : Upper extremity   5/5    Left:     Upper extremity   5/5  Lower extremity   5/5     Lower extremity   5/5 Tone and bulk:normal tone throughout; no atrophy noted Sensory: Light touch intact throughout, bilaterally  Cerebellar: normal finger-to-nose  Gait: Not tested   ASSESSMENT/PLAN [name] is a 81 y.o. female with history of hypertension, renal insufficiency, and palpitations presenting with confusion and speech difficulties. She did not receive IV t-PA due to improvement in deficits.  Strokes:  Dominant possibly embolic from left ICA high grade stenosis.  Resultant resolution of deficits  MRI  Small patchy multi focal acute left ischemic infarcts.  MRA  severe diffuse cerebrovascular disease.  Carotid Doppler R 40-59%, L > 80% ICA stenosis  2D Echo  No source of embolus   LDL 111  HgbA1c 5.3  VTE prophylaxis - subcutaneous heparin Diet Heart Room service appropriate?: Yes; Fluid consistency:: Thin  aspirin 81 mg orally every day prior to admission, now on aspirin 81 mg  orally every day and clopidogrel 75 mg orally every day. Given large vessel intracranial atherosclerosis, patient should be treated with aspirin 81 mg and clopidogrel 75 mg orally every day x 3 months for secondary stroke prevention. After 3 months, change to plavix alone. Long-term dual antiplatelets are contraindicated due to risk for intracerebral hemorrhage. Surgical decision may affect antiplatelet recommendations.   Patient counseled to be compliant with her antithrombotic medications  Vascular surgery consult pending for L ICA stenosis - surgery vs medical management  Therapy recommendations: SNF  Disposition: pending (pt lived home alone PTA, sister from [place] wants her to go home with her, patient hesitant as she just wants to get help in her own home)  Left carotid stenosis  High grade stenosis per CUS  Recommend vascular surgery consult regarding intervention vs. Medical management.  Avoid hypotension   BP goal 130-160  Ok to d/c from stroke standpoint if VVS decides on no intervention.  Hypertension  Stable  Permissive hypertension (OK if < 220/120) but gradually normalize in 5-7 days  Avoid hypotension  BP goal 130-160  Hyperlipidemia  Home meds:  No lipid lowering medications prior to admission.   LDL 111, goal < 70  New Zocor 20 mg daily  Continue statin at discharge  Other Stroke  Risk Factors  Advanced age  Other Active Problems  Anemia  Hospital day # 1  Neurology will sign off. Please call with questions. Pt will follow up with Dr. Roda Shutters at [facility] in about 2 months. Thanks for the consult.  Marvel Plan, MD PhD Stroke Neurology 02/19/2015 2:33 PM    To contact Stroke Continuity provider, please refer to [email or web address]. After hours, contact General Neurology

## 2015-02-19 NOTE — Clinical Social Work Placement (Signed)
   CLINICAL SOCIAL WORK PLACEMENT  NOTE  Date:  02/19/2015  Patient Details  Name: Patricia [name] MRN: [id number] Date of Birth: 31-Jan-1919  Clinical Social Work is seeking post-discharge placement for this patient at the Skilled  Nursing Facility level of care (*CSW will initial, date and re-position this form in  chart as items are completed):  Yes   Patient/family provided with [facility] Clinical Social Work Department's list of facilities offering this level of care within the geographic area requested by the patient (or if unable, by the patient's family).  Yes   Patient/family informed of their freedom to choose among providers that offer the needed level of care, that participate in Medicare, Medicaid or managed care program needed by the patient, have an available bed and are willing to accept the patient.  Yes   Patient/family informed of [facility]'s ownership interest in [facility] and [facility], as well as of the fact that they are under no obligation to receive care at these facilities.  PASRR submitted to EDS on 02/19/15     PASRR number received on 02/19/15     Existing PASRR number confirmed on       FL2 transmitted to all facilities in geographic area requested by pt/family on       FL2 transmitted to all facilities within larger geographic area on       Patient informed that his/her managed care company has contracts with or will negotiate with certain facilities, including the following:        Yes   Patient/family informed of bed offers received.  Patient chooses bed at [facility]     Physician recommends and patient chooses bed at      Patient to be transferred to [facility], [place] on 02/19/15.  Patient to be transferred to facility by [facility]      Patient family notified on 02/19/15 of transfer.  Name of family member notified:  Domingo Pulse, sister      PHYSICIAN       Additional Comment:     _______________________________________________ Gwynne Edinger, LCSW 02/19/2015, 2:55 PM

## 2015-02-19 NOTE — Clinical Social Work Note (Signed)
Clinical Social Work Assessment  Patient Details  Name: Patricia [name] MRN: [id number] Date of Birth: 01-31-1919  Date of referral:  02/19/15               Reason for consult:  Facility Placement                Permission sought to share information with:  Family Supports Permission granted to share information::  Yes, Verbal Permission Granted  Name::     Espinoza,Barbara  Relationship::  sister   Housing/Transportation Living arrangements for the past 2 months:  Apartment Source of Information:  Other (Comment Required) (sister ) Patient Interpreter Needed:  None Criminal Activity/Legal Involvement Pertinent to Current Situation/Hospitalization:  No - Comment as needed Significant Relationships:  Siblings, Other Family Members Lives with:  Self Do you feel safe going back to the place where you live?  No Need for family participation in patient care:  Yes (Comment)  Care giving concerns: Pamala Hurry expressed concerns with the pt having a stroke and living along.   Social Worker assessment / plan:  CSW met the pt and her family at the bedside. CSW introduced self and purpose of the visit. CSW discussed SNF rehab. CSW explained the SNF process. CSW explained insurance and its relation to SNF placement.  CSW provided the pt's sister Pamala Hurry with a SNF list. Pamala Hurry explained that she would prefer for the pt to be placed in [place], since most of the family is there. CSW spoke with Hassan Rowan at [facility] to inquire about bed offer. CSW awaiting a call back. CSW answered all questions in which the [place] inquired about. CSW will continue to follow this pt and assist with discharge as needed.   Employment status:  Retired Nurse, adult PT Recommendations:  [place] / Referral to community resources:  [place]  Patient/Family's Response to care: Pamala Hurry reported that the care in which the pt received thus far has been  well.    Patient/Family's Understanding of and Emotional Response to Diagnosis, Current Treatment, and Prognosis:  Pamala Hurry acknowledged the pt's current condition. Pamala Hurry shared that she did not feel comfortable with the pt going home only prior to receiving rehab. Pamala Hurry is agreeable to the discharge plan.   Emotional Assessment Appearance:  Appears stated age Attitude/Demeanor/Rapport:   (Calm ) Affect (typically observed):  Appropriate Orientation:  Oriented to Self, Oriented to Place Alcohol / Substance use:  Not Applicable Psych involvement (Current and /or in the community):  No (Comment)  Discharge Needs  Concerns to be addressed:  Denies Needs/Concerns at this time Readmission within the last 30 days:  No Current discharge risk:  None Barriers to Discharge:  No Barriers Identified   [place], LCSW 02/19/2015, 12:20 PM

## 2015-02-19 NOTE — Consult Note (Signed)
Hospital Consult    Reason for Consult:  Stroke with left ICA stenosis  Referring Physician:  Neurology MRN #:  [id number]  History of Present Illness: This is a 81 y.o. female who was admitted Saturday with aphasia.  She states that on Friday, she was hurrying to get to the bathroom and tripped on the table and hit her head.  She did not get knocked out or lose consciousness.  She states that she did not have any weakness in her legs, but just tripped on the table bc she did not want to urinate on herself.  She states that on Saturday, she did her chores and a family member called her later that day.  She says she was talking funny and at that time, EMS was called and she was brought to the hospital.  The pt states that her symptoms have resolved.  She states that she did not have any weakness or paralysis of any arm or leg.   She states that she lives by herself here in [place], she goes to church regularly and sings in the choir.  She continues to do her chores around the house.  Her family states that if she needs to go to rehab, her family would like to take her to [place] to stay with her sister and her brother and this may even be permanent as she has fallen in the past as well.  She does take a beta blocker, CCB, HCTZ, and ACEI for HTN.  She takes a daily aspirin.  She has been placed on Plavix and a statin since admission.  She denies any previous stroke, MI, chest pain.  She did have some renal insufficiency with a creatinine this admission of 1.59, but is down to 1.17 today.  Her surgical hx includes a right knee replacement and hysterectomy.  Past Medical History  Diagnosis Date  . HTN (hypertension)   . Renal insufficiency     a. Based on labs available in Epic.  Marland Kitchen Palpitations     a. Event monitor 11/2013: rare atrial ectopy, brief atrial runs (longest 1.25seconds), SVE complexes represent 0.82% of total beats, no atrial fibirillation seen, wandering pacemaker, slowest HR 40bpm.   . Cataract     Past Surgical History  Procedure Laterality Date  . Total knee arthroplasty      right  . Abdominal hysterectomy      No Known Allergies  Prior to Admission medications   Medication Sig Start Date End Date Taking? Authorizing Provider  acetaminophen (TYLENOL) 325 MG tablet Take 650 mg by mouth every 6 (six) hours as needed for pain or fever.   Yes Historical Provider, MD  amLODipine-benazepril (LOTREL) 10-20 MG per capsule Take 1 capsule by mouth daily.   Yes Historical Provider, MD  aspirin 81 MG chewable tablet Chew 81 mg by mouth daily.   Yes Historical Provider, MD  hydrochlorothiazide (HYDRODIURIL) 25 MG tablet Take 1 tablet by mouth daily. 09/25/14  Yes Historical Provider, MD  metoprolol (LOPRESSOR) 50 MG tablet Take 50 mg by mouth 2 (two) times daily.   Yes Historical Provider, MD  polyethylene glycol powder (GLYCOLAX/MIRALAX) powder Take 17 g by mouth 2 (two) times daily. Until daily soft stools 10/28/14  Yes Everlene Farrier, PA-C  pramoxine (PROCTOFOAM) 1 % foam Place 1 application rectally 3 (three) times daily as needed for itching. 10/28/14  Yes Everlene Farrier, PA-C    Social History   Social History  . Marital Status: Single    Spouse  Name: N/A  . Number of Children: N/A  . Years of Education: N/A   Occupational History  . Not on file.   Social History Main Topics  . Smoking status: Never Smoker   . Smokeless tobacco: Never Used  . Alcohol Use: No  . Drug Use: No  . Sexual Activity: Not on file   Other Topics Concern  . Not on file   Social History Narrative   Lives alone.  No longer drives.           Family History  Problem Relation Age of Onset  . Diabetes Father   . Diabetes Sister   . Diabetes Brother     ROS: [x]  Positive   [ ]  Negative   [ ]  All sytems reviewed and are negative  Cardiovascular: []  chest pain/pressure []  palpitations []  SOB lying flat []  DOE []  pain in legs while walking []  pain in legs at rest []  pain  in legs at night []  non-healing ulcers []  hx of DVT []  swelling in legs  Pulmonary: []  productive cough []  asthma/wheezing []  home O2  Neurologic: []  weakness in []  arms []  legs []  numbness in []  arms []  legs [x]  hx of CVA []  mini stroke [x] difficulty speaking or slurred speech-resolved []  temporary loss of vision in one eye []  dizziness  Hematologic: []  hx of cancer []  bleeding problems []  problems with blood clotting easily  Endocrine:   []  diabetes []  thyroid disease  GI []  vomiting blood []  blood in stool  GU: []  CKD/renal failure []  HD--[]  M/W/F or []  T/T/S []  burning with urination []  blood in urine  Psychiatric: []  anxiety []  depression  Musculoskeletal: [x]  arthritis []  joint pain [x]  hx right knee replacement  Integumentary: []  rashes []  ulcers  Constitutional: []  fever []  chills   Physical Examination  Filed Vitals:   02/19/15 1001  BP: 160/48  Pulse: 55  Temp: 98.6 F (37 C)  Resp: 19   Body mass index is 22.09 kg/(m^2).  General:  Thin female in NAD Gait: Not observed HENT: WNL, normocephalic Pulmonary: normal non-labored breathing, without Rales, rhonchi,  wheezing Cardiac: regular, without  Murmurs, rubs or gallops; with left carotid bruit Abdomen: soft, NT/ND, no masses Skin: without rashes, without ulcers  Vascular Exam/Pulses:  Right Left  Radial 2+ (normal) 2+ (normal)  Ulnar Unable to palpate  Unable to palpate   DP Unable to palpate  Unable to palpate   PT Unable to palpate  Unable to palpate    Extremities: without ischemic changes, without Gangrene , without cellulitis; without open wounds;  Musculoskeletal: no muscle wasting or atrophy  Neurologic: A&O X 3; Appropriate Affect ; SENSATION: normal; MOTOR FUNCTION:  moving all extremities equally. Speech is fluent/normal  CBC    Component Value Date/Time   WBC 6.1 02/17/2015 2112   RBC 3.27* 02/17/2015 2112   HGB 10.5* 02/17/2015 2112   HCT 32.6*  02/17/2015 2112   PLT 178 02/17/2015 2112   MCV 99.7 02/17/2015 2112   MCH 32.1 02/17/2015 2112   MCHC 32.2 02/17/2015 2112   RDW 12.9 02/17/2015 2112   LYMPHSABS 2.1 02/17/2015 2112   MONOABS 0.6 02/17/2015 2112   EOSABS 0.2 02/17/2015 2112   BASOSABS 0.0 02/17/2015 2112    BMET    Component Value Date/Time   NA 139 02/19/2015 0532   K 4.1 02/19/2015 0532   CL 106 02/19/2015 0532   CO2 24 02/19/2015 0532   GLUCOSE 80 02/19/2015 0532   BUN  26* 02/19/2015 0532   CREATININE 1.17* 02/19/2015 0532   CREATININE 1.22* 01/09/2014 1628   CALCIUM 9.0 02/19/2015 0532   GFRNONAA 39* 02/19/2015 0532   GFRAA 45* 02/19/2015 0532    COAGS: Lab Results  Component Value Date   INR 1.2 10/07/2007   INR 1.0 09/30/2007     Non-Invasive Vascular Imaging:   Carotid duplex 02/18/15: Findings suggest upper range 40-59% right internal carotid artery stenosis. Elevated left internal carotid artery systolic velocities and ICA/CCA ratio suggest >80% stenosis, however end diastolic velocities suggest upper range 1-39% stenosis. Bilateral vertebral arteries are patent with antegrade flow.  Statin:  Yes.   (started this admission) Beta Blocker:  Yes.   Aspirin:  Yes.   ACEI:  Yes.   ARB:  No. Other antiplatelets/anticoagulants:  Yes.   Plavix   ASSESSMENT/PLAN: This is a 81 y.o. female with stroke with aphasia that her symptoms have mostly resolved  -pt did have a carotid duplex, which revealed a greater than 80% stenosis on the left, however, the end diastolic velocity revealed 1-39% stenosis so it is unclear as to how tight her stenosis really is.   -we will let her discharge home and recover for 1-2 weeks and f/u with Dr. Darrick Penna in 1-2 weeks and repeat carotid duplex in our office at that time and further determine if what if any intervention needs to be done.   -this was discussed with the pt and family members and they are in agreement.   Doreatha Massed, PA-C [facility] [phone]  Pt with recent CVA primarily language deficits which have almost resolved but still occasionally struggling to gather words.  Also some balance issues with ambulation.    Her duplex shows high PSV > 400 on the left ICA but EDV is quite low making the study equivocal of minimal vs severe stenosis.  In light of her age would give her a few weeks to recover and repeat duplex in our lab.  If less than 60% probably medical management.    Findings and details of plan discussed with pt and family  Fabienne Bruns, MD [facility] Office: [phone] Pager: [phone]

## 2015-02-19 NOTE — Progress Notes (Signed)
[facility] PROGRESS NOTE  Patricia [name] RUE:[id number] DOB: 02-18-1919 DOA: 02/17/2015 PCP: Lupe Carney, MD  HPI/Subjective: Today the patient is alert, oriented, and follows commands. No physical complaints at this time. Expresses concern over getting home to find her "pocketbook and money", but refuses to tell family the location of items. Would like to complete rehab at home if possible.   Assessment/Plan: Acute ischemic stroke: Speech improved. MRI brain shows patchy multifocal acute ischemic infarct in the left frontal/parietal lobe. MRA brain shows diffuse cerebrovascular disease. LDL 111 (goal <70)-started statin. A1c still pending. Spoke with neurology-recommendations to start aspirin and Plavix. Echocardiogram WNL (EF: 60-65% with no wall motion abnormalities). Carotid show 40-59% R internal carotid stenosis with 1-39% L internal carotid stenosis. PT recommends SNF. Plan is to find SNF.    Dyslipidemia: LDL 111 (goal <70)-started statin. No issues thus far.   HTN (hypertension): Blood pressure continues to fluctuate, but remains at an appropriate level-continue amlodipine, benazepril and HCTZ. Bradycardia still present- continue to hold metoprolol. Monitor and adjust accordingly.   Sinus Bradycardia: Continue to hold metoprolol. TSH WNL. Asymptomatic.  Chronic kidney disease stage III: Continues to improve. Repeat electrolytes in a.m.  DVT Prophylaxis: Continue Heparin.    Code Status: Full Family Communication: None at bedside  Disposition Plan: Discharge to SNF.    Consultants:  Neurology   Procedures:  None  Antibiotics:  None     Objective: Filed Vitals:   02/19/15 1001  BP: 160/48  Pulse: 55  Temp: 98.6 F (37 C)  Resp: 19   No intake or output data in the 24 hours ending 02/19/15 1007 Filed Weights   02/18/15 0045 02/18/15 1400  Weight: 64.275 kg (141 lb 11.2 oz) 64 kg (141 lb 1.5 oz)    Exam:   General:  Well-appearing female, in no  acute distress  Cardiovascular: RRR, no M/R/G   Respiratory: CTA b/l, no wheezo or crackles   Abdomen: Soft, non-tender, non-distended  Neuro: A&Ox3, Grip strength equal bilaterally. UE/LE moving appropriately with equal strength at 5/5. No focal deficits noted.  Data Reviewed: Basic Metabolic Panel:  Recent Labs Lab 02/17/15 2112 02/19/15 0532  NA 142 139  K 4.3 4.1  CL 109 106  CO2 25 24  GLUCOSE 120* 80  BUN 40* 26*  CREATININE 1.59* 1.17*  CALCIUM 9.7 9.0   Liver Function Tests:  Recent Labs Lab 02/17/15 2112  AST 21  ALT 11*  ALKPHOS 72  BILITOT 0.3  PROT 7.0  ALBUMIN 3.6   No results for input(s): LIPASE, AMYLASE in the last 168 hours. No results for input(s): AMMONIA in the last 168 hours. CBC:  Recent Labs Lab 02/17/15 2112  WBC 6.1  NEUTROABS 3.3  HGB 10.5*  HCT 32.6*  MCV 99.7  PLT 178   Cardiac Enzymes: No results for input(s): CKTOTAL, CKMB, CKMBINDEX, TROPONINI in the last 168 hours. BNP (last 3 results) No results for input(s): BNP in the last 8760 hours.  ProBNP (last 3 results) No results for input(s): PROBNP in the last 8760 hours.  CBG: No results for input(s): GLUCAP in the last 168 hours.  No results found for this or any previous visit (from the past 240 hour(s)).   Studies: Dg Chest 2 View  02/17/2015   CLINICAL DATA:  Altered level of consciousness today.  EXAM: CHEST  2 VIEW  COMPARISON:  09/30/2007  FINDINGS: Shallow inspiration. Normal heart size and pulmonary vascularity. Calcified and tortuous aorta. Mediastinal contents appear intact. Slight fibrosis  or atelectasis in the left lung base. No blunting of costophrenic angles. No pneumothorax. No focal consolidation. Degenerative changes in the spine and shoulders.  IMPRESSION: Fibrosis or atelectasis in the left lung base. No evidence of active pulmonary disease.   Electronically Signed   By: Burman Nieves M.D.   On: 02/17/2015 21:38   Ct Head Wo Contrast  02/17/2015    CLINICAL DATA:  81 year old female with altered mental status.  EXAM: CT HEAD WITHOUT CONTRAST  TECHNIQUE: Contiguous axial images were obtained from the base of the skull through the vertex without intravenous contrast.  COMPARISON:  None.  FINDINGS: There is slight prominence of the ventricles and sulci compatible with age-related volume loss. Mild periventricular and deep white matter hypodensities represent chronic microvascular ischemic changes. There is no intracranial hemorrhage. No mass effect or midline shift identified.  The visualized paranasal sinuses and mastoid air cells are well aerated. The calvarium is intact.  IMPRESSION: No acute intracranial pathology.  Mild age-related atrophy and chronic microvascular ischemic disease.  If symptoms persist and there are no contraindications, MRI may provide better evaluation if clinically indicated   Electronically Signed   By: Elgie Collard M.D.   On: 02/17/2015 21:57   Mr Brain Wo Contrast  02/18/2015   CLINICAL DATA:  81 year old female with history of hypertension new present in with acute aphasia, confusion. Symptoms now resolved.  EXAM: MRI HEAD WITHOUT CONTRAST  MRA HEAD WITHOUT CONTRAST  TECHNIQUE: Multiplanar, multiecho pulse sequences of the brain and surrounding structures were obtained without intravenous contrast. Angiographic images of the head were obtained using MRA technique without contrast.  COMPARISON:  Prior CT from 02/17/2015.  FINDINGS: MRI HEAD FINDINGS  Diffuse prominence of the CSF containing spaces is compatible with generalized age-related cerebral atrophy. Patchy and confluent T2/FLAIR hyperintensity within the periventricular and deep white matter of both cerebral hemispheres most consistent with chronic small vessel ischemic disease, fairly mild for patient age. No areas of chronic infarction identified.  There are patchy multi focal small ischemic infiltrates involving the periatrial white matter adjacent to the left  lateral ventricle as well as within the overlying cortical gray matter and subcortical white matter of the posterior left frontal and parietal lobes (series 4, image 24, 26, 28 on axial sequence, series 11, image 11, 8 on coronal sequence). No associated hemorrhage or mass effect. Absent flow void within the distal vertebral artery on the right. Intracranial flow voids otherwise maintained. No acute or chronic intracranial hemorrhage.  No mass lesion, midline shift, or mass effect. Ventricular prominence related to global parenchymal volume loss present without hydrocephalus. No extra-axial fluid collection.  Craniocervical junction is widely patent. Multilevel degenerative spondylolysis noted within the visualized upper cervical spine.  Pituitary gland normal.  No acute abnormality about the orbits. Sequelae of prior lens extraction noted on the left. There is a T2 hyperintense, mildly FLAIR hyperintense 9 mm lesion at the posterior aspect of the right globe in the region of the optic disc (series 6, image 11), indeterminate.  Mild scattered mucosal thickening within the ethmoidal air cells. Paranasal sinuses are otherwise clear. Mild opacity within the inferior mastoid air cells, right greater than left. Inner ear structures grossly normal.  Bone marrow signal intensity within normal limits. Scalp soft tissues unremarkable.  MRA HEAD FINDINGS  ANTERIOR CIRCULATION:  Visualized distal cervical segments of the internal carotid arteries are widely patent with antegrade flow. Petrous segments well opacified bilaterally. Multi focal atheromatous irregularity present within the cavernous and supraclinoid  segments. There is focal severe stenosis of the supraclinoid right ICA at the right ICA terminus (series 5, image 58). Additional focal moderate to severe stenosis within the supraclinoid left ICA (series 5, image 62).  Atheromatous irregularity with mild narrowing within the left A1 segment. Right A1 segment well  opacified. Anterior communicating artery normal. Anterior cerebral arteries well opacified to their distal aspects.  Short segment severe stenosis within the proximal left M1 segment (series 501, image 13). Additional short-segment moderate to severe stenosis within the proximal right M1 segment (series 501, image 13). M1 segments well opacified distally. Distal branch atheromatous irregularity within the MCA branches bilaterally.  POSTERIOR CIRCULATION:  Left vertebral artery demonstrates mild atheromatous irregularity but is patent to the vertebrobasilar junction. Right vertebral artery not visualized, likely occluded. Left posterior inferior cerebellar artery patent. Right posterior inferior cerebral artery not well visualized. Basilar artery widely patent. Superior cerebellar arteries patent proximally. Both the posterior cerebral arteries arise from the basilar artery. There is a short segment severe stenosis within the proximal left P2 segment (series 506, image 12). Left PCA is opacified distally. Mild narrowing of the right P1 segment with distal atheromatous irregularity. Right PCA is opacified to its distal aspect.  No aneurysm or vascular malformation.  IMPRESSION: MRI HEAD IMPRESSION:  1. Small patchy multi focal acute ischemic infarcts within the left periatrial white matter and overlying cortical gray matter and subcortical white matter of the posterior left frontal lobe and left parietal lobe. No associated hemorrhage or mass effect. 2. Generalized cerebral atrophy with mild chronic small vessel ischemic disease. 3. 9 mm cystic lesion at the posterior aspect of the right globe in the region of the macula, likely a small macular cyst related to macular degeneration. Correlation with physical exam and direct visualization as clinically desired.  MRA HEAD IMPRESSION:  1. No acute large or proximal arterial branch occlusion identified within the intracranial circulation. 2. Nonvisualization of the right  vertebral artery, likely occluded. Left vertebral artery widely patent to the vertebrobasilar junction. 3. Focal short segment severe stenosis within the proximal left M1 segment, with a slightly less severe short-segment moderate to severe stenosis within the proximal right M1 segment. 4. Short segment severe stenosis within the proximal left P2 segment without occlusion. 5. Atheromatous irregularity with severe stenosis at the supraclinoid right ICA, with similar but slightly less severe stenosis at the supraclinoid left ICA.   Electronically Signed   By: Rise Mu M.D.   On: 02/18/2015 08:24   Mr Maxine Glenn Head/brain Wo Cm  02/18/2015   CLINICAL DATA:  81 year old female with history of hypertension new present in with acute aphasia, confusion. Symptoms now resolved.  EXAM: MRI HEAD WITHOUT CONTRAST  MRA HEAD WITHOUT CONTRAST  TECHNIQUE: Multiplanar, multiecho pulse sequences of the brain and surrounding structures were obtained without intravenous contrast. Angiographic images of the head were obtained using MRA technique without contrast.  COMPARISON:  Prior CT from 02/17/2015.  FINDINGS: MRI HEAD FINDINGS  Diffuse prominence of the CSF containing spaces is compatible with generalized age-related cerebral atrophy. Patchy and confluent T2/FLAIR hyperintensity within the periventricular and deep white matter of both cerebral hemispheres most consistent with chronic small vessel ischemic disease, fairly mild for patient age. No areas of chronic infarction identified.  There are patchy multi focal small ischemic infiltrates involving the periatrial white matter adjacent to the left lateral ventricle as well as within the overlying cortical gray matter and subcortical white matter of the posterior left frontal and parietal lobes (  series 4, image 24, 26, 28 on axial sequence, series 11, image 11, 8 on coronal sequence). No associated hemorrhage or mass effect. Absent flow void within the distal vertebral  artery on the right. Intracranial flow voids otherwise maintained. No acute or chronic intracranial hemorrhage.  No mass lesion, midline shift, or mass effect. Ventricular prominence related to global parenchymal volume loss present without hydrocephalus. No extra-axial fluid collection.  Craniocervical junction is widely patent. Multilevel degenerative spondylolysis noted within the visualized upper cervical spine.  Pituitary gland normal.  No acute abnormality about the orbits. Sequelae of prior lens extraction noted on the left. There is a T2 hyperintense, mildly FLAIR hyperintense 9 mm lesion at the posterior aspect of the right globe in the region of the optic disc (series 6, image 11), indeterminate.  Mild scattered mucosal thickening within the ethmoidal air cells. Paranasal sinuses are otherwise clear. Mild opacity within the inferior mastoid air cells, right greater than left. Inner ear structures grossly normal.  Bone marrow signal intensity within normal limits. Scalp soft tissues unremarkable.  MRA HEAD FINDINGS  ANTERIOR CIRCULATION:  Visualized distal cervical segments of the internal carotid arteries are widely patent with antegrade flow. Petrous segments well opacified bilaterally. Multi focal atheromatous irregularity present within the cavernous and supraclinoid segments. There is focal severe stenosis of the supraclinoid right ICA at the right ICA terminus (series 5, image 58). Additional focal moderate to severe stenosis within the supraclinoid left ICA (series 5, image 62).  Atheromatous irregularity with mild narrowing within the left A1 segment. Right A1 segment well opacified. Anterior communicating artery normal. Anterior cerebral arteries well opacified to their distal aspects.  Short segment severe stenosis within the proximal left M1 segment (series 501, image 13). Additional short-segment moderate to severe stenosis within the proximal right M1 segment (series 501, image 13). M1 segments  well opacified distally. Distal branch atheromatous irregularity within the MCA branches bilaterally.  POSTERIOR CIRCULATION:  Left vertebral artery demonstrates mild atheromatous irregularity but is patent to the vertebrobasilar junction. Right vertebral artery not visualized, likely occluded. Left posterior inferior cerebellar artery patent. Right posterior inferior cerebral artery not well visualized. Basilar artery widely patent. Superior cerebellar arteries patent proximally. Both the posterior cerebral arteries arise from the basilar artery. There is a short segment severe stenosis within the proximal left P2 segment (series 506, image 12). Left PCA is opacified distally. Mild narrowing of the right P1 segment with distal atheromatous irregularity. Right PCA is opacified to its distal aspect.  No aneurysm or vascular malformation.  IMPRESSION: MRI HEAD IMPRESSION:  1. Small patchy multi focal acute ischemic infarcts within the left periatrial white matter and overlying cortical gray matter and subcortical white matter of the posterior left frontal lobe and left parietal lobe. No associated hemorrhage or mass effect. 2. Generalized cerebral atrophy with mild chronic small vessel ischemic disease. 3. 9 mm cystic lesion at the posterior aspect of the right globe in the region of the macula, likely a small macular cyst related to macular degeneration. Correlation with physical exam and direct visualization as clinically desired.  MRA HEAD IMPRESSION:  1. No acute large or proximal arterial branch occlusion identified within the intracranial circulation. 2. Nonvisualization of the right vertebral artery, likely occluded. Left vertebral artery widely patent to the vertebrobasilar junction. 3. Focal short segment severe stenosis within the proximal left M1 segment, with a slightly less severe short-segment moderate to severe stenosis within the proximal right M1 segment. 4. Short segment severe stenosis within the  proximal left P2 segment without occlusion. 5. Atheromatous irregularity with severe stenosis at the supraclinoid right ICA, with similar but slightly less severe stenosis at the supraclinoid left ICA.   Electronically Signed   By: Rise Mu M.D.   On: 02/18/2015 08:24    Scheduled Meds: . amLODipine  10 mg Oral Daily   And  . benazepril  20 mg Oral Daily  . aspirin  81 mg Oral Daily  . clopidogrel  75 mg Oral Daily  . heparin  5,000 Units Subcutaneous 3 times per day  . hydrochlorothiazide  25 mg Oral Daily  . polyethylene glycol  17 g Oral BID  . simvastatin  20 mg Oral q1800   Continuous Infusions:   Principal Problem:   TIA (transient ischemic attack) Active Problems:   HTN (hypertension)   Acute ischemic stroke    Time spent: 30 minutes    Clarke Amburn, Student-PA  [facility] If 7PM-7AM, please contact night-coverage at [email or web address], password [facility] 02/19/2015, 10:07 AM  LOS: 1 day

## 2015-02-20 ENCOUNTER — Telehealth: Payer: Self-pay | Admitting: Vascular Surgery

## 2015-02-20 NOTE — Telephone Encounter (Signed)
LM for pt re appt, dpm °

## 2015-02-20 NOTE — Telephone Encounter (Signed)
-----   Message from Sharee Pimple, RN sent at 02/19/2015  3:39 PM EDT ----- Regarding: schedule   ----- Message -----    From: Sherren Kerns, MD    Sent: 02/19/2015   1:55 PM      To: [facility] Charge Pool  Level 4 consult for symptomatic carotid stenosis. She needs follow up appt with me in 2 weeks with repeat bilateral carotid duplex at that visit  Fabienne Bruns

## 2015-03-06 ENCOUNTER — Ambulatory Visit (HOSPITAL_COMMUNITY)
Admission: RE | Admit: 2015-03-06 | Discharge: 2015-03-06 | Disposition: A | Discharge status: Home / Self Care | Payer: Medicare Other | Source: Ambulatory Visit | Attending: Vascular Surgery | Admitting: Vascular Surgery

## 2015-03-06 ENCOUNTER — Encounter: Payer: Self-pay | Admitting: Vascular Surgery

## 2015-03-06 DIAGNOSIS — I6523 Occlusion and stenosis of bilateral carotid arteries: Secondary | ICD-10-CM | POA: Insufficient documentation

## 2015-03-06 DIAGNOSIS — I10 Essential (primary) hypertension: Secondary | ICD-10-CM | POA: Insufficient documentation

## 2015-03-08 ENCOUNTER — Ambulatory Visit (INDEPENDENT_AMBULATORY_CARE_PROVIDER_SITE_OTHER): Payer: Medicare Other | Admitting: Vascular Surgery

## 2015-03-08 ENCOUNTER — Ambulatory Visit: Payer: Medicare Other | Admitting: Vascular Surgery

## 2015-03-08 ENCOUNTER — Encounter: Payer: Self-pay | Admitting: Vascular Surgery

## 2015-03-08 VITALS — BP 219/71 | HR 63 | Temp 98.1°F | Resp 14 | Ht 67.0 in | Wt 141.0 lb

## 2015-03-08 DIAGNOSIS — I6522 Occlusion and stenosis of left carotid artery: Secondary | ICD-10-CM | POA: Diagnosis not present

## 2015-03-08 NOTE — Progress Notes (Signed)
History of Present Illness: This is a 81 y.o. female who had a stroke several weeks ago. Her primary symptom was a fascia. She states that she lives by herself here in [place], she goes to church regularly and sings in the choir.  She continues to do her chores around the house.  she is currently on Plavix and a statin. She has completely recovered from her stroke symptoms at this point. She denies any new stroke symptoms.    Past Medical History   Diagnosis  Date   .  HTN (hypertension)     .  Renal insufficiency         a. Based on labs available in Epic.   Marland Kitchen  Palpitations         a. Event monitor 11/2013: rare atrial ectopy, brief atrial runs (longest 1.25seconds), SVE complexes represent 0.82% of total beats, no atrial fibirillation seen, wandering pacemaker, slowest HR 40bpm.   .  Cataract         Past Surgical History   Procedure  Laterality  Date   .  Total knee arthroplasty           right   .  Abdominal hysterectomy         No Known Allergies    Current Outpatient Prescriptions on File Prior to Visit  Medication Sig Dispense Refill  . acetaminophen (TYLENOL) 325 MG tablet Take 650 mg by mouth every 6 (six) hours as needed for pain or fever.    Marland Kitchen amLODipine-benazepril (LOTREL) 10-20 MG per capsule Take 1 capsule by mouth daily.    Marland Kitchen aspirin 81 MG chewable tablet Chew 81 mg by mouth daily.    . clopidogrel (PLAVIX) 75 MG tablet Take 1 tablet (75 mg total) by mouth daily.    . hydrALAZINE (APRESOLINE) 25 MG tablet Take 1 tablet (25 mg total) by mouth 2 (two) times daily.    . hydrochlorothiazide (HYDRODIURIL) 25 MG tablet Take 1 tablet by mouth daily.  1  . polyethylene glycol powder (GLYCOLAX/MIRALAX) powder Take 17 g by mouth 2 (two) times daily. Until daily soft stools 250 g 0  . pramoxine (PROCTOFOAM) 1 % foam Place 1 application rectally 3 (three) times daily as needed for itching. 15 g 0  . simvastatin (ZOCOR) 20 MG tablet Take 1 tablet (20 mg total) by mouth daily at  6 PM.     No current facility-administered medications on file prior to visit.    Social History      Social History   .  Marital Status:  Single       Spouse Name:  N/A   .  Number of Children:  N/A   .  Years of Education:  N/A      Occupational History   .  Not on file.      Social History Main Topics   .  Smoking status:  Never Smoker    .  Smokeless tobacco:  Never Used   .  Alcohol Use:  No   .  Drug Use:  No   .  Sexual Activity:  Not on file      Other Topics  Concern   .  Not on file      Social History Narrative     Lives alone.  No longer drives.                  Family History   Problem  Relation  Age of Onset   .  Diabetes  Father     .  Diabetes  Sister     .  Diabetes  Brother       ROS:  Positive    Negative    All sytems reviewed and are negative  Cardiovascular:  chest pain/pressure  palpitations  SOB lying flat  DOE  pain in legs while walking  pain in legs at rest  pain in legs at night  non-healing ulcers  hx of DVT  swelling in legs  Pulmonary:  productive cough  asthma/wheezing  home O2  Neurologic:  weakness in  arms  legs  numbness in  arms  legs  hx of CVA  mini stroke difficulty speaking or slurred speech-resolved  temporary loss of vision in one eye  dizziness  Hematologic:  hx of cancer  bleeding problems  problems with blood clotting easily  Endocrine:     diabetes  thyroid disease  GI  vomiting blood  blood in stool  GU:  CKD/renal failure  HD--[]  M/W/F or  T/T/S  burning with urination  blood in urine  Psychiatric:  anxiety  depression  Musculoskeletal:  arthritis  joint pain  hx right knee replacement  Integumentary:  rashes  ulcers  Constitutional:  fever  chills   Physical Examination    Filed Vitals:   03/08/15 1331 03/08/15 1335 03/08/15 1336  BP: 222/70 218/72 219/71  Pulse:  66 64 63  Temp: 98.1 F (36.7 C)    TempSrc: Oral    Resp: 14    Height:  (1.702 m)    Weight: 141 lb (63.957 kg)    SpO2: 99%      General:  Thin female in NAD Gait: Ambulates with a cane HENT: WNL, normocephalic Pulmonary: normal non-labored breathing, without Rales, rhonchi,  wheezing Cardiac: regular, without  Murmurs, rubs or gallops; with left carotid bruit Abdomen: soft, NT/ND, no masses Skin: without rashes, without ulcers  Vascular Exam/Pulses:   Right  Left   Radial  2+ (normal)  2+ (normal)   Ulnar  Unable to palpate    Unable to palpate     DP  Unable to palpate    Unable to palpate     PT  Unable to palpate    Unable to palpate      Musculoskeletal: no muscle wasting or atrophy       Neurologic: A&O X 3; Appropriate Affect ; SENSATION: normal; MOTOR FUNCTION:  moving all extremities equally. Speech is fluent/normal   data: Patient had a repeat carotid duplex exam today which shows bilateral 60-80% internal carotid artery stenosis.   ASSESSMENT/PLAN: This is a 82 y.o. female with stroke recently now fully recovered. Patient has a 60-80% left internal carotid artery stenosis. I discussed with the patient today the risks benefits and possible complications of left carotid endarterectomy in the advantages of surgical versus medical therapy for her carotid stenosis. She is going to have further discussions with her family regarding whether or not she wishes to undergo left carotid endarterectomy. If she wishes to proceed we will plan on getting cardiac risk stratification prior to this. She will follow-up with me in 1 month if she decides not to go through a carotid endarterectomy at this time.  Fabienne Bruns, MD [facility] Office: [phone] Pager: [phone]   Statin:  Yes.   Beta Blocker:  Yes.   Aspirin: Yes.   ACEI: Yes.   ARB: No. Other antiplatelets/anticoagulants:  Yes.  Plavix

## 2015-04-03 ENCOUNTER — Encounter: Payer: Self-pay | Admitting: Vascular Surgery

## 2015-04-05 ENCOUNTER — Ambulatory Visit (INDEPENDENT_AMBULATORY_CARE_PROVIDER_SITE_OTHER): Payer: Medicare Other | Admitting: Vascular Surgery

## 2015-04-05 ENCOUNTER — Encounter: Payer: Self-pay | Admitting: Vascular Surgery

## 2015-04-05 VITALS — BP 185/56 | HR 75 | Ht 67.0 in | Wt 149.0 lb

## 2015-04-05 DIAGNOSIS — I6523 Occlusion and stenosis of bilateral carotid arteries: Secondary | ICD-10-CM

## 2015-04-05 NOTE — Addendum Note (Signed)
Addended by: Adria DillELDRIDGE-LEWIS, Jacksyn Beeks L on: 04/05/2015 04:50 PM   Modules accepted: Orders

## 2015-04-05 NOTE — Progress Notes (Signed)
History of Present Illness: This is a 81 y.o. female who had a stroke several weeks ago. Her primary symptom was aphasia. She states that she lives by herself here in [place], she goes to church regularly and sings in the choir.  She continues to do her chores around the house.  She is currently on Plavix and a statin. She has completely recovered from her stroke symptoms at this point. She denies any new stroke symptoms.    Past Medical History    Diagnosis   Date    .   HTN (hypertension)       .   Renal insufficiency             a. Based on labs available in Epic.    Marland Kitchen   Palpitations             a. Event monitor 11/2013: rare atrial ectopy, brief atrial runs (longest 1.25seconds), SVE complexes represent 0.82% of total beats, no atrial fibirillation seen, wandering pacemaker, slowest HR 40bpm.    .   Cataract           Past Surgical History    Procedure   Laterality   Date    .   Total knee arthroplasty                right    .   Abdominal hysterectomy            No Known Allergies    Current Outpatient Prescriptions on File Prior to Visit   Medication  Sig  Dispense  Refill   .  acetaminophen (TYLENOL) 325 MG tablet  Take 650 mg by mouth every 6 (six) hours as needed for pain or fever.       Marland Kitchen  amLODipine-benazepril (LOTREL) 10-20 MG per capsule  Take 1 capsule by mouth daily.       Marland Kitchen  aspirin 81 MG chewable tablet  Chew 81 mg by mouth daily.       .  clopidogrel (PLAVIX) 75 MG tablet  Take 1 tablet (75 mg total) by mouth daily.       .  hydrALAZINE (APRESOLINE) 25 MG tablet  Take 1 tablet (25 mg total) by mouth 2 (two) times daily.       .  hydrochlorothiazide (HYDRODIURIL) 25 MG tablet  Take 1 tablet by mouth daily.    1   .  polyethylene glycol powder (GLYCOLAX/MIRALAX) powder  Take 17 g by mouth 2 (two) times daily. Until daily soft stools  250 g  0   .  pramoxine (PROCTOFOAM) 1 % foam  Place 1 application rectally 3 (three) times daily as needed for itching.  15 g  0     .  simvastatin (ZOCOR) 20 MG tablet  Take 1 tablet (20 mg total) by mouth daily at 6 PM.          No current facility-administered medications on file prior to visit.       Social History       Social History    .   Marital Status:   Single          Spouse Name:   N/A    .   Number of Children:   N/A    .   Years of Education:   N/A       Occupational History    .   Not on file.       Social History  Main Topics    .   Smoking status:   Never Smoker     .   Smokeless tobacco:   Never Used    .   Alcohol Use:   No    .   Drug Use:   No    .   Sexual Activity:   Not on file       Other Topics   Concern    .   Not on file       Social History Narrative       Lives alone.  No longer drives.                       Family History    Problem   Relation   Age of Onset    .   Diabetes   Father       .   Diabetes   Sister       .   Diabetes   Brother         ROS: [x]  Positive   [ ]  Negative   [ ]  All sytems reviewed and are negative  Cardiovascular: []  chest pain/pressure []  palpitations []  SOB lying flat []  DOE []  pain in legs while walking []  pain in legs at rest []  pain in legs at night []  non-healing ulcers []  hx of DVT []  swelling in legs  Pulmonary: []  productive cough []  asthma/wheezing []  home O2  Neurologic: []  weakness in []  arms []  legs []  numbness in []  arms []  legs [x]  hx of CVA []  mini stroke [x] difficulty speaking or slurred speech-resolved []  temporary loss of vision in one eye []  dizziness  Hematologic: []  hx of cancer []  bleeding problems []  problems with blood clotting easily  Endocrine:    []  diabetes []  thyroid disease  GI []  vomiting blood []  blood in stool  GU: []  CKD/renal failure []  HD--[]  M/W/F or []  T/T/S []  burning with urination []  blood in urine  Psychiatric: []  anxiety []  depression  Musculoskeletal: [x]  arthritis []  joint pain [x]  hx right knee replacement  Integumentary: []  rashes []   ulcers  Constitutional: []  fever []  chills   Physical Examination    Filed Vitals:   04/05/15 1430 04/05/15 1435  BP: 214/74 185/56  Pulse: 75   Height: 5\' 7"  (1.702 m)   Weight: 149 lb (67.586 kg)   SpO2: 100%     General:  Thin female in NAD Gait: Ambulates with a cane Cardiac: regular, without  Murmurs, rubs or gallops; with left carotid bruit Neurologic: A&O X 3; Appropriate Affect ; SENSATION: normal; MOTOR FUNCTION:  moving all extremities equally. Speech is fluent/normal  ASSESSMENT/PLAN: This is a 81 y.o. female with stroke recently now fully recovered. Patient has a 60-80% left internal carotid artery stenosis. I discussed with the patient today the risks benefits and possible complications of left carotid endarterectomy in the advantages of surgical versus medical therapy for her carotid stenosis. She is going to have further discussions with her family regarding whether or not she wishes to undergo left carotid endarterectomy. If she wishes to proceed we will plan on getting cardiac risk stratification prior to this. She will follow-up with me in 6 months.  Fabienne Brunsharles Fields, MD [facility] Office: [phone] Pager: [phone]

## 2015-04-23 ENCOUNTER — Ambulatory Visit (INDEPENDENT_AMBULATORY_CARE_PROVIDER_SITE_OTHER): Payer: Medicare Other | Admitting: Neurology

## 2015-04-23 ENCOUNTER — Encounter: Payer: Self-pay | Admitting: Neurology

## 2015-04-23 VITALS — BP 180/70 | HR 65 | Ht 67.0 in | Wt 144.4 lb

## 2015-04-23 DIAGNOSIS — R4189 Other symptoms and signs involving cognitive functions and awareness: Secondary | ICD-10-CM

## 2015-04-23 DIAGNOSIS — E785 Hyperlipidemia, unspecified: Secondary | ICD-10-CM

## 2015-04-23 DIAGNOSIS — I6523 Occlusion and stenosis of bilateral carotid arteries: Secondary | ICD-10-CM

## 2015-04-23 DIAGNOSIS — I639 Cerebral infarction, unspecified: Secondary | ICD-10-CM

## 2015-04-23 DIAGNOSIS — I10 Essential (primary) hypertension: Secondary | ICD-10-CM | POA: Diagnosis not present

## 2015-04-23 NOTE — Patient Instructions (Addendum)
-   continue ASA and plavix for another one month and then only plavix, and take off the ASA - continue zocor for stroke prevention. - increase hydralazine to 3 times a day.  - check BP at home and record and bring over to Dr. Clovis RileyMitchell for medication adjustment. BP goal 130-150 due to right ICA stenosis - Follow up with your primary care physician for stroke risk factor modification. Recommend maintain blood pressure goal <130/80, diabetes with hemoglobin A1c goal below 6.5% and lipids with LDL cholesterol goal below 70 mg/dL.  - follow up with Dr. Darrick PennaFields regularly. - follow up in about 2 months.

## 2015-04-24 DIAGNOSIS — I10 Essential (primary) hypertension: Secondary | ICD-10-CM | POA: Insufficient documentation

## 2015-04-24 NOTE — Progress Notes (Signed)
STROKE NEUROLOGY FOLLOW UP NOTE  NAME: Patricia [name] DOB: 09/02/1918  REASON FOR VISIT: stroke follow up HISTORY FROM: pt and chart  Today we had the pleasure of seeing Patricia [name] in follow-up at our Neurology Clinic. Pt was accompanied by no one.   History Summary Patricia [name] is a 81 y.o. female with history of hypertension, renal insufficiency, and palpitations was admitted on 02/17/15 for confusion and speech difficulties. Patricia [name] did not receive IV t-PA due to improvement in deficits. MRI showed multifocal acute left MCA territory infarcts. MRA showed severe diffuse intracranial stenosis. CUS showed right 40-59% and left >80% ICA stenosis. TTE and A1C unremarkable and LDL 111. Patricia [name] was put on dual antiplatelet and zocor. Patricia [name] neuro deficit was gradually resolved and discharged to SNF. VVS consult was done in the hospital and Patricia [name] also followed up with Dr. Darrick Penna as outpt, discussed about left CEA, however, pt optioned to medical therapy instead of intervention.   Interval History During the interval time, the patient has been doing well. No recurrent symptoms. Patricia [name] is still on ASA and plavix now. Patricia [name] BP today high in clinic 180/70. Patricia [name] did not check BP at home. Patricia [name] is on amlodipine, hydralazine and HCTZ at home. Patricia [name] stated that Patricia [name] takes care of medication by herself. Patricia [name] has appointment with PCP on 06/05/15.     REVIEW OF SYSTEMS: Full 14 system review of systems performed and notable only for those listed below and in HPI above, all others are negative:  Constitutional:  Weight loss Cardiovascular:  Ear/Nose/Throat:   Skin:  Eyes:  Loss of vision Respiratory:   Gastroitestinal:   Genitourinary:  Hematology/Lymphatic:   Endocrine:  Musculoskeletal:   Allergy/Immunology:   Neurological:   Psychiatric:  Sleep:   The following represents the patient's updated allergies and side effects list: No Known Allergies  The neurologically relevant items on the patient's problem list  were reviewed on today's visit.  Neurologic Examination  A problem focused neurological exam (12 or more points of the single system neurologic examination, vital signs counts as 1 point, cranial nerves count for 8 points) was performed.  Blood pressure 180/70, pulse 65, height  (1.702 m), weight 144 lb 6.4 oz (65.499 kg).  General - Well nourished, well developed, in no apparent distress.  Ophthalmologic - Fundi not visualized due to noncooperation.  Cardiovascular - Regular rate and rhythm.  Mental Status -  Level of arousal and orientation to time, place, and person were intact. Language including expression, naming, repetition, comprehension was assessed and found intact. Psychomotor slowing  Cranial Nerves II - XII - II - Visual field intact OU. III, IV, VI - Extraocular movements intact. V - Facial sensation intact bilaterally. VII - Facial movement intact bilaterally. VIII - hard of hearing & vestibular intact bilaterally. X - Palate elevates symmetrically. XI - Chin turning & shoulder shrug intact bilaterally. XII - Tongue protrusion intact.  Motor Strength - The patient's strength was 5-/5 in all extremities and pronator drift was absent.  Bulk was normal and fasciculations were absent.   Motor Tone - Muscle tone was assessed at the neck and appendages and was normal.  Reflexes - The patient's reflexes were 1+ in all extremities and Patricia [name] had no pathological reflexes.  Sensory - Light touch, temperature/pinprick were assessed and were normal.    Coordination - The patient had normal movements in the hands with no ataxia or dysmetria.  Tremor was absent.  Gait and Station - walk with cane,  small stride, very slow in motion.  Data reviewed: I personally reviewed the images and agree with the radiology interpretations.  Dg Chest 2 View 02/17/2015  Fibrosis or atelectasis in the left lung base. No evidence of active pulmonary disease.   Ct Head Wo  Contrast 02/17/2015  No acute intracranial pathology. Mild age-related atrophy and chronic microvascular ischemic disease. If symptoms persist and there are no contraindications, MRI may provide better evaluation if clinically indicated   MRI HEAD  02/18/2015  1. Small patchy multi focal acute ischemic infarcts within the left periatrial white matter and overlying cortical gray matter and subcortical white matter of the posterior left frontal lobe and left parietal lobe. No associated hemorrhage or mass effect.  2. Generalized cerebral atrophy with mild chronic small vessel ischemic disease.  3. 9 mm cystic lesion at the posterior aspect of the right globe in the region of the macula, likely a small macular cyst related to macular degeneration. Correlation with physical exam and direct visualization as clinically desired.   MRA HEAD 02/18/2015  1. No acute large or proximal arterial branch occlusion identified within the intracranial circulation.  2. Nonvisualization of the right vertebral artery, likely occluded. Left vertebral artery widely patent to the vertebrobasilar junction.  3. Focal short segment severe stenosis within the proximal left M1 segment, with a slightly less severe short-segment moderate to severe stenosis within the proximal right M1 segment.  4. Short segment severe stenosis within the proximal left P2 segment without occlusion.  5. Atheromatous irregularity with severe stenosis at the supraclinoid right ICA, with similar but slightly less severe stenosis at the supraclinoid left ICA.   Carotid Doppler Findings suggest upper range 40-59% right internal carotid artery stenosis. Elevated left internal carotid artery systolic velocities and ICA/CCA ratio suggest >80% stenosis, however end diastolic velocities suggest upper range 1-39% stenosis. Bilateral vertebral arteries are patent with antegrade flow.  2D Echocardiogram  - Left ventricle: The cavity size  was normal. Wall thickness wasnormal. Systolic function was normal. The estimated ejectionfraction was in the range of 60% to 65%. Wall motion was normal;there were no regional wall motion abnormalities. Dopplerparameters are consistent with abnormal left ventricularrelaxation (grade 1 diastolic dysfunction). - Aortic valve: There was mild regurgitation. - Mitral valve: There was mild to moderate regurgitation. - Atrial septum: No defect or patent foramen ovale was identified. - Tricuspid valve: There was moderate regurgitation. - Pulmonic valve: There was moderate regurgitation. - Pulmonary arteries: PA peak pressure: 62 mm Hg (S). Impressions: No cardiac source of emboli was indentified.  Component     Latest Ref Rng 01/09/2014 02/18/2015  Cholesterol     0 - 200 mg/dL  213189  Triglycerides     <150 mg/dL  39  HDL Cholesterol     >40 mg/dL  70  Total CHOL/HDL Ratio       2.7  VLDL     0 - 40 mg/dL  8  LDL (calc)     0 - 99 mg/dL  086111 (H)  Hemoglobin [id number]     4.8 - 5.6 %  5.3  Mean Plasma Glucose       105  TSH     0.350 - 4.500 uIU/mL 2.100 2.234  Free T4     0.80 - 1.80 ng/dL 4.691.24     Assessment: As you may recall, Patricia [name] is a 81 y.o. African American female with PMH of hypertension, renal insufficiency, and palpitations was admitted on 02/17/15 for multifocal acute left MCA territory  infarcts. MRA showed severe diffuse intracranial stenosis. CUS showed right 40-59% and left >80% ICA stenosis. TTE and A1C unremarkable and LDL 111. Patricia [name] was put on dual antiplatelet and zocor. Patricia [name] neuro deficit was gradually resolved and discharged to SNF. VVS consult was done in the hospital and Patricia [name] also followed up with Dr. Darrick Penna as outpt, discussed about left CEA, however, pt optioned to medical therapy instead of intervention. During the interval time, the patient has been doing well. However, Patricia [name] BP still high, on amlodipine, hydralazine and HCTZ at home. Will increase hydralazine dose. Due to ICA  stenosis, BP goal 130-160.  Plan:  - continue ASA and plavix for another one month and then plavix alone - continue zocor for stroke prevention. - increase hydralazine to  tid.  - check BP at home and record and bring over to Dr. Clovis Riley for medication adjustment.  - BP goal 130-160 due to ICA stenosis - Follow up with your primary care physician for stroke risk factor modification. Recommend maintain blood pressure goal <130/80, diabetes with hemoglobin A1c goal below 6.5% and lipids with LDL cholesterol goal below 70 mg/dL.  - follow up with Dr. Darrick Penna as scheduled. - follow up in about 2 months.  No orders of the defined types were placed in this encounter.    Meds ordered this encounter  Medications  . meloxicam (MOBIC) 15 MG tablet    Sig: Take 15 mg by mouth daily.    Patient Instructions  - continue ASA and plavix for another one month and then only plavix, and take off the ASA - continue zocor for stroke prevention. - increase hydralazine to 3 times a day.  - check BP at home and record and bring over to Dr. Clovis Riley for medication adjustment. BP goal 130-150 due to right ICA stenosis - Follow up with your primary care physician for stroke risk factor modification. Recommend maintain blood pressure goal <130/80, diabetes with hemoglobin A1c goal below 6.5% and lipids with LDL cholesterol goal below 70 mg/dL.  - follow up with Dr. Darrick Penna regularly. - follow up in about 2 months.   Marvel Plan, MD PhD [facility] [address] [place], [place] [address] [phone]

## 2015-07-04 ENCOUNTER — Ambulatory Visit: Payer: Medicare Other | Admitting: Neurology

## 2015-09-26 ENCOUNTER — Encounter: Payer: Self-pay | Admitting: Family

## 2015-10-04 ENCOUNTER — Encounter: Payer: Self-pay | Admitting: Family

## 2015-10-04 ENCOUNTER — Ambulatory Visit (INDEPENDENT_AMBULATORY_CARE_PROVIDER_SITE_OTHER): Payer: Medicare Other | Admitting: Family

## 2015-10-04 ENCOUNTER — Ambulatory Visit (HOSPITAL_COMMUNITY)
Admission: RE | Admit: 2015-10-04 | Discharge: 2015-10-04 | Disposition: A | Discharge status: Home / Self Care | Payer: Medicare Other | Source: Ambulatory Visit | Attending: Family | Admitting: Family

## 2015-10-04 VITALS — BP 183/74 | HR 62 | Temp 97.8°F | Resp 14 | Ht 66.0 in | Wt 135.0 lb

## 2015-10-04 DIAGNOSIS — I6523 Occlusion and stenosis of bilateral carotid arteries: Secondary | ICD-10-CM

## 2015-10-04 DIAGNOSIS — Z8673 Personal history of transient ischemic attack (TIA), and cerebral infarction without residual deficits: Secondary | ICD-10-CM | POA: Diagnosis not present

## 2015-10-04 DIAGNOSIS — I10 Essential (primary) hypertension: Secondary | ICD-10-CM | POA: Diagnosis not present

## 2015-10-04 NOTE — Patient Instructions (Signed)
Stroke Prevention Some medical conditions and behaviors are associated with an increased chance of having a stroke. You may prevent a stroke by making healthy choices and managing medical conditions. HOW CAN I REDUCE MY RISK OF HAVING A STROKE?   Stay physically active. Get at least 30 minutes of activity on most or all days.  Do not smoke. It may also be helpful to avoid exposure to secondhand smoke.  Limit alcohol use. Moderate alcohol use is considered to be:  No more than 2 drinks per day for men.  No more than 1 drink per day for nonpregnant women.  Eat healthy foods. This involves:  Eating 5 or more servings of fruits and vegetables a day.  Making dietary changes that address high blood pressure (hypertension), high cholesterol, diabetes, or obesity.  Manage your cholesterol levels.  Making food choices that are high in fiber and low in saturated fat, trans fat, and cholesterol may control cholesterol levels.  Take any prescribed medicines to control cholesterol as directed by your health care provider.  Manage your diabetes.  Controlling your carbohydrate and sugar intake is recommended to manage diabetes.  Take any prescribed medicines to control diabetes as directed by your health care provider.  Control your hypertension.  Making food choices that are low in salt (sodium), saturated fat, trans fat, and cholesterol is recommended to manage hypertension.  Ask your health care provider if you need treatment to lower your blood pressure. Take any prescribed medicines to control hypertension as directed by your health care provider.  If you are 18-39 years of age, have your blood pressure checked every 3-5 years. If you are 40 years of age or older, have your blood pressure checked every year.  Maintain a healthy weight.  Reducing calorie intake and making food choices that are low in sodium, saturated fat, trans fat, and cholesterol are recommended to manage  weight.  Stop drug abuse.  Avoid taking birth control pills.  Talk to your health care provider about the risks of taking birth control pills if you are over 35 years old, smoke, get migraines, or have ever had a blood clot.  Get evaluated for sleep disorders (sleep apnea).  Talk to your health care provider about getting a sleep evaluation if you snore a lot or have excessive sleepiness.  Take medicines only as directed by your health care provider.  For some people, aspirin or blood thinners (anticoagulants) are helpful in reducing the risk of forming abnormal blood clots that can lead to stroke. If you have the irregular heart rhythm of atrial fibrillation, you should be on a blood thinner unless there is a good reason you cannot take them.  Understand all your medicine instructions.  Make sure that other conditions (such as anemia or atherosclerosis) are addressed. SEEK IMMEDIATE MEDICAL CARE IF:   You have sudden weakness or numbness of the face, arm, or leg, especially on one side of the body.  Your face or eyelid droops to one side.  You have sudden confusion.  You have trouble speaking (aphasia) or understanding.  You have sudden trouble seeing in one or both eyes.  You have sudden trouble walking.  You have dizziness.  You have a loss of balance or coordination.  You have a sudden, severe headache with no known cause.  You have new chest pain or an irregular heartbeat. Any of these symptoms may represent a serious problem that is an emergency. Do not wait to see if the symptoms will   go away. Get medical help at once. Call your local emergency services (911 in [place] Do not drive yourself to the hospital.   This information is not intended to replace advice given to you by your health care provider. Make sure you discuss any questions you have with your health care provider.   Document Released: 07/03/2004 Document Revised: 06/16/2014 Document Reviewed:  11/26/2012 Elsevier Interactive Patient Education 2016 [employer]  

## 2015-10-04 NOTE — Progress Notes (Signed)
Chief Complaint: Extracranial Carotid Artery Stenosis   History of Present Illness  Patricia [name] is a 82 y.o. female patient of Dr. Darrick Penna who had a stroke in 2016. Her primary symptom was aphasia. Pt denies any subsequent stroke or TIA.  She states that she lives by herself here in [place], she goes to church regularly. She continues to do her chores around the house. She is currently on Plavix and a statin. She has completely recovered from her stroke symptoms at this point. She denies any new stroke symptoms.  She has resolving shingles on her left flank.  Pt Diabetic: no Pt smoker: non-smoker  Pt meds include: Statin : yes ASA: yes Other anticoagulants/antiplatelets: Plavix   Past Medical History  Diagnosis Date  . HTN (hypertension)   . Renal insufficiency     a. Based on labs available in Epic.  Marland Kitchen Palpitations     a. Event monitor 11/2013: rare atrial ectopy, brief atrial runs (longest 1.25seconds), SVE complexes represent 0.82% of total beats, no atrial fibirillation seen, wandering pacemaker, slowest HR 40bpm.  . Cataract   . Stroke [facility])     Social History Social History  Substance Use Topics  . Smoking status: Never Smoker   . Smokeless tobacco: Never Used  . Alcohol Use: No    Family History Family History  Problem Relation Age of Onset  . Diabetes Father   . Diabetes Sister   . Diabetes Brother     Surgical History Past Surgical History  Procedure Laterality Date  . Total knee arthroplasty      right  . Abdominal hysterectomy      No Known Allergies  Current Outpatient Prescriptions  Medication Sig Dispense Refill  . acetaminophen (TYLENOL) 325 MG tablet Take 650 mg by mouth every 6 (six) hours as needed for pain or fever.    Marland Kitchen amLODipine (NORVASC) 5 MG tablet   0  . aspirin 81 MG chewable tablet Chew 81 mg by mouth daily.    . clopidogrel (PLAVIX) 75 MG tablet Take 1 tablet (75 mg total) by mouth daily.    . hydrALAZINE (APRESOLINE)  25 MG tablet Take 1 tablet (25 mg total) by mouth 2 (two) times daily.    . hydrochlorothiazide (HYDRODIURIL) 25 MG tablet Take 1 tablet by mouth daily.  1  . meloxicam (MOBIC) 15 MG tablet Take 15 mg by mouth daily.    . metoprolol (LOPRESSOR) 50 MG tablet   1  . polyethylene glycol powder (GLYCOLAX/MIRALAX) powder Take 17 g by mouth 2 (two) times daily. Until daily soft stools 250 g 0  . simvastatin (ZOCOR) 20 MG tablet Take 1 tablet (20 mg total) by mouth daily at 6 PM.     No current facility-administered medications for this visit.    Review of Systems : See HPI for pertinent positives and negatives.  Physical Examination  Filed Vitals:   10/04/15 1458 10/04/15 1504 10/04/15 1505  BP: 184/60 180/70 183/74  Pulse: 56 56 62  Temp: 97.8 F (36.6 C)    TempSrc: Oral    Resp: 14    Height:  (1.676 m)    Weight: 135 lb (61.236 kg)    SpO2: 99%     Body mass index is 21.8 kg/(m^2).  General: WDWN elderly female in NAD GAIT: slow, deliberate, using cane Eyes: PERRLA Pulmonary:  Respirations are non-labored, CTAB Cardiac: regular rhythm, no detected murmur.  VASCULAR EXAM Carotid Bruits Right Left   Positive Positive  Aorta is not palpable. Radial pulses are 2+ palpable and equal.                                                                                                                            LE Pulses Right Left       POPLITEAL  not palpable   not palpable       POSTERIOR TIBIAL  not palpable   not palpable        DORSALIS PEDIS      ANTERIOR TIBIAL faintly palpable  faintly palpable     Gastrointestinal: soft, nontender, BS WNL, no r/g,  no palpable masses.  Musculoskeletal: age related muscle atrophy/wasting. M/S 4/5 throughout, extremities without ischemic changes. Ankles and feet with 1-2+ pitting edema.  Neurologic: A&O X 3; Appropriate Affect, Speech is normal CN 2-12 intact, pain and light touch intact in extremities, Motor exam as listed  above.   Non-Invasive Vascular Imaging CAROTID DUPLEX 10/04/2015   Right ICA: 40 - 59 % stenosis. Left ICA: <40% stenosis, likely underestimated due to calcific plaque. Velocities in the 60-79% range could not be obtained on this exam as it was on the previous duplex on 03/06/15, most likely due to calcific plaque.   Assessment: Patricia [name] is a 82 y.o. female who had a stroke in 2016. Her primary symptom was aphasia. Pt denies any subsequent stroke or TIA.  Today's carotid duplex indicates 40 - 59 % right ICA stenosis. Left ICA: <40% stenosis, likely underestimated due to calcific plaque. Velocities in the 60-79% range could not be obtained on this exam as it was on the previous duplex on 03/06/15, most likely due to calcific plaque.  I advised pt to see her PCP ASAP re her elevated blood pressure.  Plan: Follow-up in 6 months with Carotid Duplex scan.   I discussed in depth with the patient the nature of atherosclerosis, and emphasized the importance of maximal medical management including strict control of blood pressure, blood glucose, and lipid levels, obtaining regular exercise, and continued cessation of smoking.  The patient is aware that without maximal medical management the underlying atherosclerotic disease process will progress, limiting the benefit of any interventions. The patient was given information about stroke prevention and what symptoms should prompt the patient to seek immediate medical care. Thank you for allowing us to participate in this patient's care.  Charisse MarchSuzanne Nickel, RN, MSN, FNP-C [facility] [place] Office: [phone]  Clinic Physician: Darrick PennaFields  10/04/2015 3:16 PM

## 2015-11-06 NOTE — Addendum Note (Signed)
Addended by: Sharee PimpleMCCHESNEY, MARILYN K on: 11/06/2015 05:03 PM   Modules accepted: Orders

## 2016-04-08 ENCOUNTER — Encounter: Payer: Self-pay | Admitting: Family

## 2016-04-10 ENCOUNTER — Ambulatory Visit (HOSPITAL_COMMUNITY)
Admission: RE | Admit: 2016-04-10 | Discharge: 2016-04-10 | Disposition: A | Discharge status: Home / Self Care | Payer: Medicare Other | Source: Ambulatory Visit | Attending: Family | Admitting: Family

## 2016-04-10 ENCOUNTER — Ambulatory Visit (INDEPENDENT_AMBULATORY_CARE_PROVIDER_SITE_OTHER): Payer: Medicare Other | Admitting: Family

## 2016-04-10 ENCOUNTER — Encounter: Payer: Self-pay | Admitting: Family

## 2016-04-10 VITALS — BP 192/66 | HR 55 | Temp 97.1°F | Ht 66.0 in | Wt 132.5 lb

## 2016-04-10 DIAGNOSIS — Z8673 Personal history of transient ischemic attack (TIA), and cerebral infarction without residual deficits: Secondary | ICD-10-CM | POA: Diagnosis not present

## 2016-04-10 DIAGNOSIS — I6523 Occlusion and stenosis of bilateral carotid arteries: Secondary | ICD-10-CM

## 2016-04-10 NOTE — Progress Notes (Signed)
Chief Complaint: Follow up Extracranial Carotid Artery Stenosis   History of Present Illness  Patricia [name] is a 82 y.o. female patient of Dr. Darrick PennaFields who had a stroke in 2016. Her primary symptom was aphasia. Pt denies any subsequent stroke or TIA.   She states that [place] lives by herself [place] in [place], she goes to church regularly. She continues to do her chores around the house.  She has completely recovered from her stroke symptoms at this point. She denies any new stroke symptoms.  She denies headache or dizziness.   She reports pain in her right knee, is using a rolling walker due to this.   Pt Diabetic: no Pt smoker: non-smoker  Pt meds include: Statin : yes ASA: yes Other anticoagulants/antiplatelets: Plavix   Past Medical History:  Diagnosis Date  . Cataract   . HTN (hypertension)   . Palpitations    a. Event monitor 11/2013: rare atrial ectopy, brief atrial runs (longest 1.25seconds), SVE complexes represent 0.82% of total beats, no atrial fibirillation seen, wandering pacemaker, slowest HR 40bpm.  . Renal insufficiency    a. Based on labs available in Epic.  . Stroke [facility](HCC)     Social History Social History  Substance Use Topics  . Smoking status: Never Smoker  . Smokeless tobacco: Never Used  . Alcohol use No    Family History Family History  Problem Relation Age of Onset  . Diabetes Father   . Diabetes Sister   . Diabetes Brother     Surgical History Past Surgical History:  Procedure Laterality Date  . ABDOMINAL HYSTERECTOMY    . TOTAL KNEE ARTHROPLASTY     right    No Known Allergies  Current Outpatient Prescriptions  Medication Sig Dispense Refill  . acetaminophen (TYLENOL) 325 MG tablet Take 650 mg by mouth every 6 (six) hours as needed for pain or fever.    Marland Kitchen. amLODipine (NORVASC) 5 MG tablet   0  . aspirin 81 MG chewable tablet Chew 81 mg by mouth daily.    . clopidogrel (PLAVIX) 75 MG tablet Take 1 tablet (75 mg total) by  mouth daily.    . hydrALAZINE (APRESOLINE) 25 MG tablet Take 1 tablet (25 mg total) by mouth 2 (two) times daily.    . hydrochlorothiazide (HYDRODIURIL) 25 MG tablet Take 1 tablet by mouth daily.  1  . meloxicam (MOBIC) 15 MG tablet Take 15 mg by mouth daily.    . metoprolol (LOPRESSOR) 50 MG tablet   1  . polyethylene glycol powder (GLYCOLAX/MIRALAX) powder Take 17 g by mouth 2 (two) times daily. Until daily soft stools 250 g 0  . simvastatin (ZOCOR) 20 MG tablet Take 1 tablet (20 mg total) by mouth daily at 6 PM.     No current facility-administered medications for this visit.     Review of Systems : See HPI for pertinent positives and negatives.  Physical Examination  Vitals:   04/10/16 1308  BP: (!) 192/66  Pulse: (!) 55  Temp: 97.1 F (36.2 C)  TempSrc: Oral  SpO2: 100%  Weight: 132 lb 8 oz (60.1 kg)  Height: 5\' 6"  (1.676 m)   Body mass index is 21.39 kg/m.  General: WDWN elderly female in NAD GAIT: slow, deliberate, using rolling walker Eyes: PERRLA Pulmonary:  Respirations are non-labored, CTAB Cardiac: regular rhythm, no detected murmur.  VASCULAR EXAM Carotid Bruits Right Left   Positive Positive    Aorta is not palpable. Radial pulses are 2+ palpable and  equal.                                                                                                                                          LE Pulses Right Left       POPLITEAL  not palpable  not palpable       POSTERIOR TIBIAL  not palpable  not palpable       DORSALIS PEDIS      ANTERIOR TIBIAL faintly palpable faintly palpable    Gastrointestinal: soft, nontender, BS WNL, no r/g,  no palpable masses.  Musculoskeletal: age related muscle atrophy/wasting. M/S 4/5 throughout, extremities without ischemic changes. Trace bilateral ankle edema.  Neurologic: A&O X 3; Appropriate Affect, Speech is normal CN 2-12 intact, pain and light touch intact in extremities, Motor exam as listed  above.    Assessment: Patricia [name] is a 82 y.o. female who had a stroke in 2016. Her primary symptom was aphasia. Pt denies any subsequent stroke or TIA. Will notify Dr. Clovis RileyMitchell of pt's uncontrolled htn today.  DATA Today's carotid duplex indicates 40 - 59 % right ICA stenosis. Left ICA: <40% stenosis, likely underestimated due to calcific plaque. Bilateral vertebral arteries are antegrade. Bilateral subclavian arteries are multiphasic. No significant change since the last exam on 10/04/15.    Plan: Follow-up in 1 year with Carotid Duplex scan.   I discussed in depth with the patient the nature of atherosclerosis, and emphasized the importance of maximal medical management including strict control of blood pressure, blood glucose, and lipid levels, obtaining regular exercise, and continued cessation of smoking.  The patient is aware that without maximal medical management the underlying atherosclerotic disease process will progress, limiting the benefit of any interventions. The patient was given information about stroke prevention and what symptoms should prompt the patient to seek immediate medical care. Thank you for allowing us to participate in this patient's care.  Charisse MarchSuzanne Haille Pardi, RN, MSN, FNP-C [facility] [place] Office: [phone]  Clinic Physician: Darrick PennaFields  04/10/16 1:15 PM

## 2016-04-10 NOTE — Patient Instructions (Signed)
Stroke Prevention Some medical conditions and behaviors are associated with an increased chance of having a stroke. You may prevent a stroke by making healthy choices and managing medical conditions. HOW CAN I REDUCE MY RISK OF HAVING A STROKE?   Stay physically active. Get at least 30 minutes of activity on most or all days.  Do not smoke. It may also be helpful to avoid exposure to secondhand smoke.  Limit alcohol use. Moderate alcohol use is considered to be:  No more than 2 drinks per day for men.  No more than 1 drink per day for nonpregnant women.  Eat healthy foods. This involves:  Eating 5 or more servings of fruits and vegetables a day.  Making dietary changes that address high blood pressure (hypertension), high cholesterol, diabetes, or obesity.  Manage your cholesterol levels.  Making food choices that are high in fiber and low in saturated fat, trans fat, and cholesterol may control cholesterol levels.  Take any prescribed medicines to control cholesterol as directed by your health care provider.  Manage your diabetes.  Controlling your carbohydrate and sugar intake is recommended to manage diabetes.  Take any prescribed medicines to control diabetes as directed by your health care provider.  Control your hypertension.  Making food choices that are low in salt (sodium), saturated fat, trans fat, and cholesterol is recommended to manage hypertension.  Ask your health care provider if you need treatment to lower your blood pressure. Take any prescribed medicines to control hypertension as directed by your health care provider.  If you are 18-39 years of age, have your blood pressure checked every 3-5 years. If you are 40 years of age or older, have your blood pressure checked every year.  Maintain a healthy weight.  Reducing calorie intake and making food choices that are low in sodium, saturated fat, trans fat, and cholesterol are recommended to manage  weight.  Stop drug abuse.  Avoid taking birth control pills.  Talk to your health care provider about the risks of taking birth control pills if you are over 35 years old, smoke, get migraines, or have ever had a blood clot.  Get evaluated for sleep disorders (sleep apnea).  Talk to your health care provider about getting a sleep evaluation if you snore a lot or have excessive sleepiness.  Take medicines only as directed by your health care provider.  For some people, aspirin or blood thinners (anticoagulants) are helpful in reducing the risk of forming abnormal blood clots that can lead to stroke. If you have the irregular heart rhythm of atrial fibrillation, you should be on a blood thinner unless there is a good reason you cannot take them.  Understand all your medicine instructions.  Make sure that other conditions (such as anemia or atherosclerosis) are addressed. SEEK IMMEDIATE MEDICAL CARE IF:   You have sudden weakness or numbness of the face, arm, or leg, especially on one side of the body.  Your face or eyelid droops to one side.  You have sudden confusion.  You have trouble speaking (aphasia) or understanding.  You have sudden trouble seeing in one or both eyes.  You have sudden trouble walking.  You have dizziness.  You have a loss of balance or coordination.  You have a sudden, severe headache with no known cause.  You have new chest pain or an irregular heartbeat. Any of these symptoms may represent a serious problem that is an emergency. Do not wait to see if the symptoms will   go away. Get medical help at once. Call your local emergency services (911 in [place] Do not drive yourself to the hospital.   This information is not intended to replace advice given to you by your health care provider. Make sure you discuss any questions you have with your health care provider.   Document Released: 07/03/2004 Document Revised: 06/16/2014 Document Reviewed:  11/26/2012 Elsevier Interactive Patient Education 2016 [employer]  

## 2017-04-06 ENCOUNTER — Other Ambulatory Visit: Payer: Self-pay

## 2017-04-06 DIAGNOSIS — I6523 Occlusion and stenosis of bilateral carotid arteries: Secondary | ICD-10-CM

## 2017-04-10 ENCOUNTER — Ambulatory Visit: Payer: Medicare Other | Admitting: Family

## 2017-04-10 ENCOUNTER — Ambulatory Visit (HOSPITAL_COMMUNITY): Payer: Medicare Other

## 2017-05-18 ENCOUNTER — Ambulatory Visit: Payer: Medicare Other | Admitting: Family

## 2017-05-18 ENCOUNTER — Encounter (HOSPITAL_COMMUNITY): Payer: Medicare Other

## 2017-08-10 ENCOUNTER — Encounter: Payer: Self-pay | Admitting: Family

## 2017-08-10 ENCOUNTER — Ambulatory Visit (INDEPENDENT_AMBULATORY_CARE_PROVIDER_SITE_OTHER): Payer: Medicare Other | Admitting: Family

## 2017-08-10 ENCOUNTER — Ambulatory Visit (HOSPITAL_COMMUNITY)
Admission: RE | Admit: 2017-08-10 | Discharge: 2017-08-10 | Disposition: A | Discharge status: Home / Self Care | Payer: Medicare Other | Source: Ambulatory Visit | Attending: Vascular Surgery | Admitting: Vascular Surgery

## 2017-08-10 VITALS — BP 180/76 | HR 67 | Temp 97.4°F | Resp 18 | Ht 67.0 in | Wt 128.3 lb

## 2017-08-10 DIAGNOSIS — Z8673 Personal history of transient ischemic attack (TIA), and cerebral infarction without residual deficits: Secondary | ICD-10-CM | POA: Diagnosis not present

## 2017-08-10 DIAGNOSIS — I6523 Occlusion and stenosis of bilateral carotid arteries: Secondary | ICD-10-CM | POA: Insufficient documentation

## 2017-08-10 LAB — VAS US CAROTID
LCCADDIAS: -20 cm/s
LCCADSYS: -132 cm/s
LCCAPDIAS: 15 cm/s
LCCAPSYS: 97 cm/s
LEFT ECA DIAS: 0 cm/s
LEFT VERTEBRAL DIAS: 18 cm/s
LICADSYS: -79 cm/s
Left ICA dist dias: -21 cm/s
Left ICA prox dias: 48 cm/s
Left ICA prox sys: 371 cm/s
RCCAPDIAS: -6 cm/s
RIGHT CCA MID DIAS: 13 cm/s
RIGHT ECA DIAS: 0 cm/s
Right CCA prox sys: -62 cm/s
Right cca dist sys: -151 cm/s

## 2017-08-10 NOTE — Patient Instructions (Signed)

## 2017-08-10 NOTE — Progress Notes (Signed)
Chief Complaint: Follow up Extracranial Carotid Artery Stenosis   History of Present Illness  Patricia [name] is a 84 y.o. female whom Dr. Darrick PennaFields has evaluated for extracranial carotid artery stenosis since a stroke in 2016. Her primary symptom was aphasia. Pt denies any subsequent stroke or TIA.  She states that she lives by herself here in [place], she was going to church regularly, but her balance issues have prevented this lately. She continues to do her chores around the house.  She has completely recovered from her stroke symptoms. She denies any new stroke symptoms.  She denies headache or dizziness, denies chest pain or dyspnea. She states she has not yet taken her blood pressure medications today, usually takes in the morning.   She denies post prandial abdominal pain, has a very good appetite, but has lost weight (lost 4 pounds since her visit in November 2017).   She received an injection in her left eye in February 2019 for macular degeneration.   She uses a rolling walker to ambulate.   Pt Diabetic: no Pt smoker: non-smoker  Pt meds include: Statin : yes ASA: yes Other anticoagulants/antiplatelets: Plavix   Past Medical History:  Diagnosis Date  . Cataract   . HTN (hypertension)   . Palpitations    a. Event monitor 11/2013: rare atrial ectopy, brief atrial runs (longest 1.25seconds), SVE complexes represent 0.82% of total beats, no atrial fibirillation seen, wandering pacemaker, slowest HR 40bpm.  . Renal insufficiency    a. Based on labs available in Epic.  . Stroke [facility](HCC)     Social History Social History   Tobacco Use  . Smoking status: Never Smoker  . Smokeless tobacco: Never Used  Substance Use Topics  . Alcohol use: No  . Drug use: No    Family History Family History  Problem Relation Age of Onset  . Diabetes Father   . Diabetes Sister   . Diabetes Brother     Surgical History Past Surgical History:  Procedure Laterality Date   . ABDOMINAL HYSTERECTOMY    . TOTAL KNEE ARTHROPLASTY     right    No Known Allergies  Current Outpatient Medications  Medication Sig Dispense Refill  . acetaminophen (TYLENOL) 325 MG tablet Take 650 mg by mouth every 6 (six) hours as needed for pain or fever.    Marland Kitchen. amLODipine (NORVASC) 5 MG tablet   0  . aspirin 81 MG chewable tablet Chew 81 mg by mouth daily.    . clopidogrel (PLAVIX) 75 MG tablet Take 1 tablet (75 mg total) by mouth daily.    . hydrALAZINE (APRESOLINE) 25 MG tablet Take 1 tablet (25 mg total) by mouth 2 (two) times daily.    . hydrochlorothiazide (HYDRODIURIL) 25 MG tablet Take 1 tablet by mouth daily.  1  . meloxicam (MOBIC) 15 MG tablet Take 15 mg by mouth daily.    . metoprolol (LOPRESSOR) 50 MG tablet   1  . polyethylene glycol powder (GLYCOLAX/MIRALAX) powder Take 17 g by mouth 2 (two) times daily. Until daily soft stools 250 g 0  . simvastatin (ZOCOR) 20 MG tablet Take 1 tablet (20 mg total) by mouth daily at 6 PM.     No current facility-administered medications for this visit.     Review of Systems : See HPI for pertinent positives and negatives.  Physical Examination  Vitals:   08/10/17 1142 08/10/17 1144 08/10/17 1213  BP: (!) 197/59 (!) 198/64 (!) 180/76  Pulse: 67  Resp: 18    Temp: (!) 97.4 F (36.3 C)    TempSrc: Oral    SpO2: 100%    Weight: 128 lb 4.8 oz (58.2 kg)    Height: 5\' 7"  (1.702 m)     Body mass index is 20.09 kg/m.  General: WDWN elderly female in NAD GAIT:slow, deliberate, using rolling walker HENT: Missing a few teeth, otherwise no gross abnormalities  Eyes: PERRLA Pulmonary: Respirations are non-labored, CTAB Cardiac: regular rhythm, + murmur.  VASCULAR EXAM Carotid Bruits Right Left   Positive Positive   Abdominal aortic pulse is not palpable. Radial pulses are 2+ palpable and equal.   LE Pulses Right Left  POPLITEAL not palpable not palpable  POSTERIOR TIBIAL  not palpable not palpable  DORSALIS PEDIS ANTERIOR TIBIAL faintly palpable faintly palpable    Gastrointestinal: soft, nontender, BS WNL, no r/g, no palpable masses. Musculoskeletal: age related muscle atrophy/wasting. M/S 5/5 throughout, extremities without ischemic changes. Trace bilateral ankle edema. Skin: No rashes, no ulcers, no cellulitis.   Neurologic:  A&O X 3; appropriate affect, sensation is normal; speech is normal, CN 2-12 intact, pain and light touch intact in extremities, motor exam as listed above. Psychiatric: Normal thought content, mood appropriate to clinical situation.    Assessment: Patricia [name] is a 84 y.o. female  whohad a stroke in 2016. Her primary symptom was aphasia. Pt denies any subsequent stroke or TIA.  Manual brachial pressure left arm is 180/76; pt has not yet taken her morning medications; she has no headache, no dizziness, no chest pain, no dysonea. She states she will take her medications today as soon as she gets home.   She lives alone, does her own housework, has to depend on others for transportation. She states she has family nearby.  She is oriented x3.    DATA Carotid Duplex (08/10/17): 40 - 59 % bilateral ICA stenosis. Both vertebral arteries with antegrade flow; high resistant flow in the right vertebral artery may suggest distal obstruction.  Systolic deceleration in left vertebral artery.  Normal waveforms in the right subclavian artery, stenotic in the left. Increased stenosis in the left ICA compared to the exam on 04-10-16.    Plan: Follow-up in 1 year with Carotid Duplex scan.   I discussed in depth with the patient the nature of atherosclerosis, and emphasized the importance of maximal medical management including strict control of blood pressure, blood glucose, and lipid levels, obtaining regular exercise, and continued cessation of smoking.  The patient is aware that without maximal medical management the  underlying atherosclerotic disease process will progress, limiting the benefit of any interventions. The patient was given information about stroke prevention and what symptoms should prompt the patient to seek immediate medical care. Thank you for allowing [place] to participate in this patient's care.  Charisse March, RN, MSN, FNP-C [facility] [place] Office: [phone]  Clinic Physician: Darrick Penna  08/10/17 12:17 PM

## 2018-05-25 ENCOUNTER — Other Ambulatory Visit: Payer: Self-pay

## 2018-05-25 ENCOUNTER — Encounter (HOSPITAL_COMMUNITY): Payer: Self-pay

## 2018-05-25 ENCOUNTER — Emergency Department (HOSPITAL_COMMUNITY): Payer: Medicare Other

## 2018-05-25 ENCOUNTER — Emergency Department (HOSPITAL_COMMUNITY)
Admission: EM | Admit: 2018-05-25 | Discharge: 2018-05-25 | Disposition: A | Discharge status: Home / Self Care | Payer: Medicare Other | Attending: Emergency Medicine | Admitting: Emergency Medicine

## 2018-05-25 DIAGNOSIS — Z7982 Long term (current) use of aspirin: Secondary | ICD-10-CM | POA: Insufficient documentation

## 2018-05-25 DIAGNOSIS — Z79899 Other long term (current) drug therapy: Secondary | ICD-10-CM | POA: Diagnosis not present

## 2018-05-25 DIAGNOSIS — Z7902 Long term (current) use of antithrombotics/antiplatelets: Secondary | ICD-10-CM | POA: Diagnosis not present

## 2018-05-25 DIAGNOSIS — K5641 Fecal impaction: Secondary | ICD-10-CM | POA: Insufficient documentation

## 2018-05-25 DIAGNOSIS — Z96651 Presence of right artificial knee joint: Secondary | ICD-10-CM | POA: Insufficient documentation

## 2018-05-25 DIAGNOSIS — R1032 Left lower quadrant pain: Secondary | ICD-10-CM | POA: Diagnosis present

## 2018-05-25 DIAGNOSIS — N3000 Acute cystitis without hematuria: Secondary | ICD-10-CM | POA: Diagnosis not present

## 2018-05-25 DIAGNOSIS — I10 Essential (primary) hypertension: Secondary | ICD-10-CM | POA: Diagnosis not present

## 2018-05-25 DIAGNOSIS — Z8673 Personal history of transient ischemic attack (TIA), and cerebral infarction without residual deficits: Secondary | ICD-10-CM | POA: Insufficient documentation

## 2018-05-25 LAB — CBC WITH DIFFERENTIAL/PLATELET
ABS IMMATURE GRANULOCYTES: 0.01 10*3/uL (ref 0.00–0.07)
BASOS PCT: 0 %
Basophils Absolute: 0 10*3/uL (ref 0.0–0.1)
EOS ABS: 0 10*3/uL (ref 0.0–0.5)
Eosinophils Relative: 0 %
HCT: 32.8 % — ABNORMAL LOW (ref 36.0–46.0)
Hemoglobin: 10.3 g/dL — ABNORMAL LOW (ref 12.0–15.0)
Immature Granulocytes: 0 %
Lymphocytes Relative: 22 %
Lymphs Abs: 1.2 10*3/uL (ref 0.7–4.0)
MCH: 32 pg (ref 26.0–34.0)
MCHC: 31.4 g/dL (ref 30.0–36.0)
MCV: 101.9 fL — ABNORMAL HIGH (ref 80.0–100.0)
MONO ABS: 0.6 10*3/uL (ref 0.1–1.0)
Monocytes Relative: 11 %
NEUTROS ABS: 3.8 10*3/uL (ref 1.7–7.7)
NEUTROS PCT: 67 %
PLATELETS: 175 10*3/uL (ref 150–400)
RBC: 3.22 MIL/uL — AB (ref 3.87–5.11)
RDW: 12.3 % (ref 11.5–15.5)
WBC: 5.6 10*3/uL (ref 4.0–10.5)
nRBC: 0 % (ref 0.0–0.2)

## 2018-05-25 LAB — URINALYSIS, ROUTINE W REFLEX MICROSCOPIC
Bilirubin Urine: NEGATIVE
GLUCOSE, UA: NEGATIVE mg/dL
Ketones, ur: NEGATIVE mg/dL
NITRITE: NEGATIVE
PH: 8 (ref 5.0–8.0)
Protein, ur: NEGATIVE mg/dL
Specific Gravity, Urine: 1.009 (ref 1.005–1.030)

## 2018-05-25 LAB — LIPASE, BLOOD: LIPASE: 28 U/L (ref 11–51)

## 2018-05-25 LAB — I-STAT CHEM 8, ED
BUN: 23 mg/dL (ref 8–23)
CREATININE: 1.1 mg/dL — AB (ref 0.44–1.00)
Calcium, Ion: 1.18 mmol/L (ref 1.15–1.40)
Chloride: 107 mmol/L (ref 98–111)
GLUCOSE: 84 mg/dL (ref 70–99)
HCT: 34 % — ABNORMAL LOW (ref 36.0–46.0)
Hemoglobin: 11.6 g/dL — ABNORMAL LOW (ref 12.0–15.0)
Potassium: 3.6 mmol/L (ref 3.5–5.1)
Sodium: 141 mmol/L (ref 135–145)
TCO2: 26 mmol/L (ref 22–32)

## 2018-05-25 LAB — I-STAT CG4 LACTIC ACID, ED: Lactic Acid, Venous: 1.44 mmol/L (ref 0.5–1.9)

## 2018-05-25 LAB — COMPREHENSIVE METABOLIC PANEL
ALT: 16 U/L (ref 0–44)
ANION GAP: 9 (ref 5–15)
AST: 24 U/L (ref 15–41)
Albumin: 4 g/dL (ref 3.5–5.0)
Alkaline Phosphatase: 64 U/L (ref 38–126)
BUN: 27 mg/dL — ABNORMAL HIGH (ref 8–23)
CHLORIDE: 105 mmol/L (ref 98–111)
CO2: 25 mmol/L (ref 22–32)
Calcium: 9.6 mg/dL (ref 8.9–10.3)
Creatinine, Ser: 1.16 mg/dL — ABNORMAL HIGH (ref 0.44–1.00)
GFR calc non Af Amer: 39 mL/min — ABNORMAL LOW (ref >60)
GFR, EST AFRICAN AMERICAN: 46 mL/min — AB (ref >60)
Glucose, Bld: 92 mg/dL (ref 70–99)
POTASSIUM: 3.7 mmol/L (ref 3.5–5.1)
SODIUM: 139 mmol/L (ref 135–145)
Total Bilirubin: 0.7 mg/dL (ref 0.3–1.2)
Total Protein: 7 g/dL (ref 6.5–8.1)

## 2018-05-25 MED ORDER — SODIUM CHLORIDE (PF) 0.9 % IJ SOLN
INTRAMUSCULAR | Status: AC
  Filled 2018-05-25: qty 50

## 2018-05-25 MED ORDER — HYDRALAZINE HCL 25 MG PO TABS: 25.0000 mg | ORAL_TABLET | Freq: Once | ORAL | Status: DC

## 2018-05-25 MED ORDER — SODIUM CHLORIDE 0.9 % IV BOLUS
1000.0000 mL | Freq: Once | INTRAVENOUS | Status: AC
  Administered 2018-05-25: 1000 mL via INTRAVENOUS

## 2018-05-25 MED ORDER — SORBITOL 70 % SOLN
960.0000 mL | TOPICAL_OIL | Freq: Once | ORAL | Status: AC
  Administered 2018-05-25: 960 mL via RECTAL
  Filled 2018-05-25: qty 473

## 2018-05-25 MED ORDER — LISINOPRIL 20 MG PO TABS
20.0000 mg | ORAL_TABLET | Freq: Once | ORAL | Status: AC
  Administered 2018-05-25: 20 mg via ORAL
  Filled 2018-05-25: qty 1

## 2018-05-25 MED ORDER — CEPHALEXIN 500 MG PO CAPS: 500.0000 mg | ORAL_CAPSULE | Freq: Three times a day (TID) | ORAL | 0 refills | Status: AC

## 2018-05-25 MED ORDER — SODIUM CHLORIDE 0.9 % IV SOLN
2.0000 g | Freq: Once | INTRAVENOUS | Status: AC
  Administered 2018-05-25: 2 g via INTRAVENOUS
  Filled 2018-05-25: qty 20

## 2018-05-25 MED ORDER — IOHEXOL 300 MG/ML  SOLN
75.0000 mL | Freq: Once | INTRAMUSCULAR | Status: AC | PRN
  Administered 2018-05-25: 75 mL via INTRAVENOUS

## 2018-05-25 MED ORDER — AMLODIPINE BESYLATE 5 MG PO TABS
5.0000 mg | ORAL_TABLET | Freq: Once | ORAL | Status: AC
  Administered 2018-05-25: 5 mg via ORAL
  Filled 2018-05-25: qty 1

## 2018-05-25 MED ORDER — FENTANYL CITRATE (PF) 100 MCG/2ML IJ SOLN
12.5000 ug | Freq: Once | INTRAMUSCULAR | Status: AC
  Administered 2018-05-25: 12.5 ug via INTRAVENOUS
  Filled 2018-05-25: qty 2

## 2018-05-25 MED ORDER — DOCUSATE SODIUM 250 MG PO CAPS: 250.0000 mg | ORAL_CAPSULE | Freq: Every day | ORAL | 0 refills | Status: DC

## 2018-05-25 NOTE — ED Triage Notes (Signed)
Per EMS: Pt from home.  Pt c/o of intermittent rectal pain and diarrhea.  No c/o of nausea.  Pt uses walker at home.

## 2018-05-25 NOTE — ED Notes (Signed)
Patient transported to CT 

## 2018-05-25 NOTE — ED Notes (Signed)
Unable to obtain IV access RN 2x attempt

## 2018-05-25 NOTE — ED Notes (Signed)
Bed: WA02 Expected date: 05/25/18 Expected time:  Means of arrival:  Comments: Hold ems 84 yr old

## 2018-05-25 NOTE — Discharge Instructions (Signed)
Start taking the stool softener daily.  If you go more than 24 hours without a bowel movement, I'd recommend taking a dose of over-the-counter Miralax.  Take the antibiotic as prescribed.

## 2018-05-25 NOTE — ED Provider Notes (Signed)
[facility]-EMERGENCY DEPT Provider Note   CSN: [id number] Arrival date & time: 05/25/18  [id number]     History   Chief Complaint Chief Complaint  Patient presents with  . Abdominal Pain  . Rectal Pain  . Diarrhea    HPI Rita OharaClara Carbone is a 84 y.o. female.  HPI   84 year old female with history of hypertension, stroke, mild cognitive impairment, here with abdominal pain and diarrhea.  The patient states that starting Sunday, she developed acute, cramp-like, severe sharp pain in her lower and left lower quadrant abdomen.  She has associated diarrhea whenever the pain comes on.  She then feels better after the bowel movement.  The patient states she has never had similar symptoms.  Denies any known sick contacts.  No suspicious food intake.  The pain is intermittent, crampy, and essentially largely resolves between her episodes.  These episodes are becoming increasingly frequent, however several minutes.  She denies any nausea or vomiting.  No known history of diverticulitis.  No recent antibiotic use.  Past Medical History:  Diagnosis Date  . Cataract   . HTN (hypertension)   . Palpitations    a. Event monitor 11/2013: rare atrial ectopy, brief atrial runs (longest 1.25seconds), SVE complexes represent 0.82% of total beats, no atrial fibirillation seen, wandering pacemaker, slowest HR 40bpm.  . Renal insufficiency    a. Based on labs available in Epic.  . Stroke [facility](HCC)     Patient Active Problem List   Diagnosis Date Noted  . Essential hypertension 04/24/2015  . Cognitive impairment 04/23/2015  . HLD (hyperlipidemia)   . Carotid stenosis   . Acute ischemic stroke (HCC) 02/18/2015  . TIA (transient ischemic attack) 02/17/2015  . Palpitation 11/15/2013  . HTN (hypertension) 11/15/2013    Past Surgical History:  Procedure Laterality Date  . ABDOMINAL HYSTERECTOMY    . TOTAL KNEE ARTHROPLASTY     right     OB History   No obstetric history on file.       Home Medications    Prior to Admission medications   Medication Sig Start Date End Date Taking? Authorizing Provider  acetaminophen (TYLENOL) 325 MG tablet Take 650 mg by mouth every 6 (six) hours as needed for pain or fever.   Yes [provider]  amLODipine (NORVASC) 5 MG tablet Take 5 mg by mouth daily.  09/20/15  Yes [provider]  aspirin 81 MG chewable tablet Chew 81 mg by mouth daily.   Yes [provider]  clopidogrel (PLAVIX) 75 MG tablet Take 1 tablet (75 mg total) by mouth daily. 02/19/15  Yes Ghimire, Werner LeanShanker M, MD  hydrochlorothiazide (HYDRODIURIL) 25 MG tablet Take 1 tablet by mouth daily. 09/25/14  Yes [provider]  lisinopril (PRINIVIL,ZESTRIL) 20 MG tablet Take 20 mg by mouth daily.   Yes [provider]  polyethylene glycol powder (GLYCOLAX/MIRALAX) powder Take 17 g by mouth 2 (two) times daily. Until daily soft stools 10/28/14  Yes Everlene Farrieransie, William, PA-C  simvastatin (ZOCOR) 20 MG tablet Take 1 tablet (20 mg total) by mouth daily at 6 PM. 02/19/15  Yes Ghimire, Werner LeanShanker M, MD  cephALEXin (KEFLEX) 500 MG capsule Take 1 capsule (500 mg total) by mouth 3 (three) times daily for 7 days. 05/25/18 06/01/18  Shaune PollackIsaacs, Ebbie Cherry, MD  docusate sodium (COLACE) 250 MG capsule Take 1 capsule (250 mg total) by mouth daily. 05/25/18   Shaune PollackIsaacs, Shizuo Biskup, MD  hydrALAZINE (APRESOLINE) 25 MG tablet Take 1 tablet (25 mg total)  by mouth 2 (two) times daily. Patient not taking: Reported on 05/25/2018 02/19/15   Maretta Bees, MD    Family History Family History  Problem Relation Age of Onset  . Diabetes Father   . Diabetes Sister   . Diabetes Brother     Social History Social History   Tobacco Use  . Smoking status: Never Smoker  . Smokeless tobacco: Never Used  Substance Use Topics  . Alcohol use: No  . Drug use: No     Allergies   Patient has no known allergies.   Review of Systems Review of Systems  Constitutional: Positive  for fatigue. Negative for chills and fever.  HENT: Negative for congestion and rhinorrhea.   Eyes: Negative for visual disturbance.  Respiratory: Negative for cough, shortness of breath and wheezing.   Cardiovascular: Negative for chest pain and leg swelling.  Gastrointestinal: Positive for abdominal pain and diarrhea. Negative for nausea and vomiting.  Genitourinary: Negative for dysuria and flank pain.  Musculoskeletal: Negative for neck pain and neck stiffness.  Skin: Negative for rash and wound.  Allergic/Immunologic: Negative for immunocompromised state.  Neurological: Positive for weakness. Negative for syncope and headaches.  All other systems reviewed and are negative.    Physical Exam Updated Vital Signs BP (!) 135/118   Pulse 62   Temp 98.4 F (36.9 C) (Oral)   Resp 14   Ht 5\' 7"  (1.702 m)   Wt 54.9 kg   SpO2 100%   BMI 18.95 kg/m   Physical Exam Vitals signs and nursing note reviewed.  Constitutional:      General: She is not in acute distress.    Appearance: She is well-developed.  HENT:     Head: Normocephalic and atraumatic.  Eyes:     Conjunctiva/sclera: Conjunctivae normal.  Neck:     Musculoskeletal: Neck supple.  Cardiovascular:     Rate and Rhythm: Normal rate and regular rhythm.     Heart sounds: Normal heart sounds. No murmur. No friction rub.  Pulmonary:     Effort: Pulmonary effort is normal. No respiratory distress.     Breath sounds: Normal breath sounds. No wheezing or rales.  Abdominal:     General: Bowel sounds are increased. There is no distension.     Tenderness: There is abdominal tenderness in the suprapubic area and left lower quadrant. There is guarding. There is no rebound.  Skin:    General: Skin is warm.     Capillary Refill: Capillary refill takes less than 2 seconds.  Neurological:     Mental Status: She is alert and oriented to person, place, and time.     Motor: No abnormal muscle tone.      ED Treatments / Results    Labs (all labs ordered are listed, but only abnormal results are displayed) Labs Reviewed  CBC WITH DIFFERENTIAL/PLATELET - Abnormal; Notable for the following components:      Result Value   RBC 3.22 (*)    Hemoglobin 10.3 (*)    HCT 32.8 (*)    MCV 101.9 (*)    All other components within normal limits  COMPREHENSIVE METABOLIC PANEL - Abnormal; Notable for the following components:   BUN 27 (*)    Creatinine, Ser 1.16 (*)    GFR calc non Af Amer 39 (*)    GFR calc Af Amer 46 (*)    All other components within normal limits  URINALYSIS, ROUTINE W REFLEX MICROSCOPIC - Abnormal; Notable for the following components:  APPearance CLOUDY (*)    Hgb urine dipstick MODERATE (*)    Leukocytes, UA MODERATE (*)    Bacteria, UA MANY (*)    All other components within normal limits  I-STAT CHEM 8, ED - Abnormal; Notable for the following components:   Creatinine, Ser 1.10 (*)    Hemoglobin 11.6 (*)    HCT 34.0 (*)    All other components within normal limits  URINE CULTURE  LIPASE, BLOOD  I-STAT CG4 LACTIC ACID, ED  I-STAT CG4 LACTIC ACID, ED    EKG None  Radiology Ct Abdomen Pelvis W Contrast  Result Date: 05/25/2018 CLINICAL DATA:  Intermittent rectal pain and diarrhea EXAM: CT ABDOMEN AND PELVIS WITH CONTRAST TECHNIQUE: Multidetector CT imaging of the abdomen and pelvis was performed using the standard protocol following bolus administration of intravenous contrast. CONTRAST:  75mL OMNIPAQUE IOHEXOL 300 MG/ML  SOLN COMPARISON:  Lumbar spine films of 04/16/2007 FINDINGS: Lower chest: Mild bibasilar dependent atelectasis is noted left-greater-than-right. Hepatobiliary: The liver enhances and a single subcentimeter low-attenuation structure is noted near the dome of the right lobe laterally most consistent with benign process such as cyst or hemangioma. The gallbladder is visualized and no gallstones are seen. Pancreas: The pancreas is normal in size and there is some prominence of  the pancreatic duct probably due to atrophy. No mass is evident. Spleen: The spleen is unremarkable. Adrenals/Urinary Tract: The adrenal glands appear normal. Kidneys enhance and no renal calculi are seen. There is a 1.9 cm right renal cyst medially. The renal pelves are slightly prominent but no obstruction is seen. The ureters appear normal in caliber although there are difficult to visualize in this patient with very little fat. The urinary bladder is moderately distended with no abnormality noted. Stomach/Bowel: There may be a small hiatal hernia present. The stomach is completely decompressed and can not be evaluated. No small bowel distention is seen. The rectosigmoid colon is somewhat decompressed, but there is a moderately large amount of feces within the rectum itself. No definite rectal mucosal edema is evident. There is a moderately large amount of feces throughout the entire colon. There are scattered diverticula within the rectosigmoid colon. The terminal ileum is unremarkable. The appendix is not definitely seen but no inflammatory process is noted. Vascular/Lymphatic: There is significant abdominal aortic atherosclerotic change present. No aneurysmal dilatation is seen. No adenopathy is noted. Reproductive: The uterus has previously been resected. No adnexal lesion is seen and no fluid is noted within the pelvis. Other: No abdominal wall hernia is noted. Musculoskeletal: There is a lumbar scoliosis present convex to the right with degenerative change. Anterolisthesis of L3 on L4 is noted of approximately 8 mm due to degenerative change of the facet joints. Degenerative disc disease is noticed most marked at L2-3 and L3-4 levels. IMPRESSION: 1. Moderately large amount of feces within the rectum with some distention. No definite rectal mucosal edema is seen. There are scattered rectosigmoid colon diverticula present and there is feces throughout the colon as well. 2. No hydronephrosis.  No renal  calculi. 3. Significant abdominal aortic atherosclerosis. 4. Anterolisthesis of L3 on L4 by approximately 8 mm due to degenerative change Electronically Signed   By: Dwyane Dee M.D.   On: 05/25/2018 14:10    Procedures Procedures (including critical care time)  Medications Ordered in ED Medications  sodium chloride (PF) 0.9 % injection (has no administration in time range)  sodium chloride 0.9 % bolus 1,000 mL (0 mLs Intravenous Stopped 05/25/18 1450)  fentaNYL (SUBLIMAZE) injection 12.5 mcg (12.5 mcg Intravenous Given 05/25/18 1230)  cefTRIAXone (ROCEPHIN) 2 g in sodium chloride 0.9 % 100 mL IVPB (0 g Intravenous Stopped 05/25/18 1450)  amLODipine (NORVASC) tablet 5 mg (5 mg Oral Given 05/25/18 1307)  iohexol (OMNIPAQUE) 300 MG/ML solution 75 mL (75 mLs Intravenous Contrast Given 05/25/18 1317)  sorbitol, milk of mag, mineral oil, glycerin (SMOG) enema (960 mLs Rectal Given 05/25/18 1523)  lisinopril (PRINIVIL,ZESTRIL) tablet 20 mg (20 mg Oral Given 05/25/18 1551)     Initial Impression / Assessment and Plan / ED Course  I have reviewed the triage vital signs and the nursing notes.  Pertinent labs & imaging results that were available during my care of the patient were reviewed by me and considered in my medical decision making (see chart for details).  Clinical Course as of May 25 1629  Tue May 25, 2018  1011 84 yo F here w/ tenesmus, LLQ pain. Concern for diverticulitis, colitis. No hypotension, no fever or signs of sepsis/shock. Will check imaging, labs. Low-dose fentanyl for analgesia. No recent ABX use.   [CI]  1241 Labs reviewed and are reassuring. WBC normal, CMP with mild dehydration, UA with suspected UTI. This certainly could be contributing to reported pain w/ bladder spasms. Rocephin started. Awaiting CT.   [CI]  1406 CT scan appears to show impaction - will need enema. Awaiting formal read.   [CI]  1439 CT scan is c/w impaction. Enema ordered. Can likely d/c once  complete.   [CI]  1630 Manual disimpaction performed. Tolerated well. Plan to have a BM in ED, then d/c.   [CI]    Clinical Course User Index [CI] Shaune Pollack, MD      Final Clinical Impressions(s) / ED Diagnoses   Final diagnoses:  Fecal impaction (HCC)  Acute cystitis without hematuria    ED Discharge Orders         Ordered    docusate sodium (COLACE) 250 MG capsule  Daily     05/25/18 1455    cephALEXin (KEFLEX) 500 MG capsule  3 times daily     05/25/18 1455           Shaune Pollack, MD 05/25/18 1631

## 2018-05-26 LAB — URINE CULTURE

## 2018-06-23 ENCOUNTER — Emergency Department (HOSPITAL_COMMUNITY): Payer: Medicare Other

## 2018-06-23 ENCOUNTER — Inpatient Hospital Stay (HOSPITAL_COMMUNITY)
Admission: EM | Admit: 2018-06-23 | Discharge: 2018-06-24 | DRG: 064 | Disposition: A | Discharge status: Hospice | Payer: Medicare Other | Attending: Internal Medicine | Admitting: Internal Medicine

## 2018-06-23 DIAGNOSIS — L8990 Pressure ulcer of unspecified site, unspecified stage: Secondary | ICD-10-CM | POA: Diagnosis present

## 2018-06-23 DIAGNOSIS — Z833 Family history of diabetes mellitus: Secondary | ICD-10-CM

## 2018-06-23 DIAGNOSIS — Z8673 Personal history of transient ischemic attack (TIA), and cerebral infarction without residual deficits: Secondary | ICD-10-CM

## 2018-06-23 DIAGNOSIS — Z83438 Family history of other disorder of lipoprotein metabolism and other lipidemia: Secondary | ICD-10-CM | POA: Diagnosis not present

## 2018-06-23 DIAGNOSIS — Z515 Encounter for palliative care: Secondary | ICD-10-CM | POA: Diagnosis present

## 2018-06-23 DIAGNOSIS — G936 Cerebral edema: Secondary | ICD-10-CM | POA: Diagnosis present

## 2018-06-23 DIAGNOSIS — E785 Hyperlipidemia, unspecified: Secondary | ICD-10-CM | POA: Diagnosis present

## 2018-06-23 DIAGNOSIS — I639 Cerebral infarction, unspecified: Secondary | ICD-10-CM

## 2018-06-23 DIAGNOSIS — J690 Pneumonitis due to inhalation of food and vomit: Secondary | ICD-10-CM | POA: Diagnosis present

## 2018-06-23 DIAGNOSIS — R29730 NIHSS score 30: Secondary | ICD-10-CM | POA: Diagnosis present

## 2018-06-23 DIAGNOSIS — T796XXA Traumatic ischemia of muscle, initial encounter: Secondary | ICD-10-CM

## 2018-06-23 DIAGNOSIS — H269 Unspecified cataract: Secondary | ICD-10-CM | POA: Diagnosis present

## 2018-06-23 DIAGNOSIS — Z66 Do not resuscitate: Secondary | ICD-10-CM | POA: Diagnosis present

## 2018-06-23 DIAGNOSIS — R4182 Altered mental status, unspecified: Secondary | ICD-10-CM | POA: Diagnosis not present

## 2018-06-23 DIAGNOSIS — Z821 Family history of blindness and visual loss: Secondary | ICD-10-CM | POA: Diagnosis not present

## 2018-06-23 DIAGNOSIS — S0083XA Contusion of other part of head, initial encounter: Secondary | ICD-10-CM | POA: Diagnosis present

## 2018-06-23 DIAGNOSIS — Z7902 Long term (current) use of antithrombotics/antiplatelets: Secondary | ICD-10-CM

## 2018-06-23 DIAGNOSIS — G935 Compression of brain: Secondary | ICD-10-CM | POA: Diagnosis present

## 2018-06-23 DIAGNOSIS — E872 Acidosis, unspecified: Secondary | ICD-10-CM

## 2018-06-23 DIAGNOSIS — Y92019 Unspecified place in single-family (private) house as the place of occurrence of the external cause: Secondary | ICD-10-CM

## 2018-06-23 DIAGNOSIS — S81812A Laceration without foreign body, left lower leg, initial encounter: Secondary | ICD-10-CM | POA: Diagnosis present

## 2018-06-23 DIAGNOSIS — M6282 Rhabdomyolysis: Secondary | ICD-10-CM

## 2018-06-23 DIAGNOSIS — I4891 Unspecified atrial fibrillation: Secondary | ICD-10-CM | POA: Diagnosis present

## 2018-06-23 DIAGNOSIS — Z96651 Presence of right artificial knee joint: Secondary | ICD-10-CM | POA: Diagnosis present

## 2018-06-23 DIAGNOSIS — W19XXXA Unspecified fall, initial encounter: Secondary | ICD-10-CM | POA: Diagnosis present

## 2018-06-23 DIAGNOSIS — I10 Essential (primary) hypertension: Secondary | ICD-10-CM | POA: Diagnosis present

## 2018-06-23 DIAGNOSIS — I63512 Cerebral infarction due to unspecified occlusion or stenosis of left middle cerebral artery: Secondary | ICD-10-CM | POA: Diagnosis present

## 2018-06-23 DIAGNOSIS — Z7982 Long term (current) use of aspirin: Secondary | ICD-10-CM

## 2018-06-23 DIAGNOSIS — Z7189 Other specified counseling: Secondary | ICD-10-CM | POA: Diagnosis not present

## 2018-06-23 LAB — CBC WITH DIFFERENTIAL/PLATELET
Abs Immature Granulocytes: 0.1 10*3/uL — ABNORMAL HIGH (ref 0.00–0.07)
Basophils Absolute: 0 10*3/uL (ref 0.0–0.1)
Basophils Relative: 0 %
Eosinophils Absolute: 0 10*3/uL (ref 0.0–0.5)
Eosinophils Relative: 0 %
HCT: 31.8 % — ABNORMAL LOW (ref 36.0–46.0)
Hemoglobin: 9.6 g/dL — ABNORMAL LOW (ref 12.0–15.0)
Immature Granulocytes: 1 %
LYMPHS ABS: 1 10*3/uL (ref 0.7–4.0)
Lymphocytes Relative: 9 %
MCH: 31 pg (ref 26.0–34.0)
MCHC: 30.2 g/dL (ref 30.0–36.0)
MCV: 102.6 fL — AB (ref 80.0–100.0)
Monocytes Absolute: 0.9 10*3/uL (ref 0.1–1.0)
Monocytes Relative: 8 %
Neutro Abs: 9.9 10*3/uL — ABNORMAL HIGH (ref 1.7–7.7)
Neutrophils Relative %: 82 %
Platelets: 203 10*3/uL (ref 150–400)
RBC: 3.1 MIL/uL — ABNORMAL LOW (ref 3.87–5.11)
RDW: 12.4 % (ref 11.5–15.5)
WBC: 11.9 10*3/uL — AB (ref 4.0–10.5)
nRBC: 0 % (ref 0.0–0.2)

## 2018-06-23 LAB — COMPREHENSIVE METABOLIC PANEL
ALT: 22 U/L (ref 0–44)
AST: 50 U/L — ABNORMAL HIGH (ref 15–41)
Albumin: 2.5 g/dL — ABNORMAL LOW (ref 3.5–5.0)
Alkaline Phosphatase: 65 U/L (ref 38–126)
Anion gap: 13 (ref 5–15)
BUN: 34 mg/dL — ABNORMAL HIGH (ref 8–23)
CO2: 19 mmol/L — ABNORMAL LOW (ref 22–32)
CREATININE: 1.3 mg/dL — AB (ref 0.44–1.00)
Calcium: 8.2 mg/dL — ABNORMAL LOW (ref 8.9–10.3)
Chloride: 111 mmol/L (ref 98–111)
GFR, EST AFRICAN AMERICAN: 40 mL/min — AB (ref >60)
GFR, EST NON AFRICAN AMERICAN: 34 mL/min — AB (ref >60)
Glucose, Bld: 122 mg/dL — ABNORMAL HIGH (ref 70–99)
Potassium: 3.5 mmol/L (ref 3.5–5.1)
Sodium: 143 mmol/L (ref 135–145)
Total Bilirubin: 1.1 mg/dL (ref 0.3–1.2)
Total Protein: 5.8 g/dL — ABNORMAL LOW (ref 6.5–8.1)

## 2018-06-23 LAB — CBG MONITORING, ED: Glucose-Capillary: 88 mg/dL (ref 70–99)

## 2018-06-23 LAB — APTT: aPTT: 28 seconds (ref 24–36)

## 2018-06-23 LAB — PROTIME-INR
INR: 1.22
Prothrombin Time: 15.3 seconds — ABNORMAL HIGH (ref 11.4–15.2)

## 2018-06-23 LAB — I-STAT CHEM 8, ED
BUN: 39 mg/dL — ABNORMAL HIGH (ref 8–23)
Calcium, Ion: 1.01 mmol/L — ABNORMAL LOW (ref 1.15–1.40)
Chloride: 111 mmol/L (ref 98–111)
Creatinine, Ser: 1.1 mg/dL — ABNORMAL HIGH (ref 0.44–1.00)
Glucose, Bld: 126 mg/dL — ABNORMAL HIGH (ref 70–99)
HEMATOCRIT: 30 % — AB (ref 36.0–46.0)
Hemoglobin: 10.2 g/dL — ABNORMAL LOW (ref 12.0–15.0)
Potassium: 3.5 mmol/L (ref 3.5–5.1)
Sodium: 141 mmol/L (ref 135–145)
TCO2: 22 mmol/L (ref 22–32)

## 2018-06-23 LAB — ETHANOL: Alcohol, Ethyl (B): <10 mg/dL (ref <10)

## 2018-06-23 LAB — I-STAT CG4 LACTIC ACID, ED: Lactic Acid, Venous: 2.56 mmol/L (ref 0.5–1.9)

## 2018-06-23 LAB — CK: Total CK: 1763 U/L — ABNORMAL HIGH (ref 38–234)

## 2018-06-23 MED ORDER — ONDANSETRON HCL 4 MG/2ML IJ SOLN: 4.0000 mg | Freq: Four times a day (QID) | INTRAMUSCULAR | Status: DC | PRN

## 2018-06-23 MED ORDER — SODIUM CHLORIDE 0.9 % IV SOLN: INTRAVENOUS | Status: DC

## 2018-06-23 MED ORDER — DOCUSATE SODIUM 100 MG PO CAPS: 100.0000 mg | ORAL_CAPSULE | Freq: Two times a day (BID) | ORAL | Status: DC | PRN

## 2018-06-23 MED ORDER — DIPHENHYDRAMINE HCL 50 MG/ML IJ SOLN: 12.5000 mg | INTRAMUSCULAR | Status: DC | PRN

## 2018-06-23 MED ORDER — ONDANSETRON HCL 4 MG PO TABS: 4.0000 mg | ORAL_TABLET | Freq: Four times a day (QID) | ORAL | Status: DC | PRN

## 2018-06-23 MED ORDER — HALOPERIDOL 0.5 MG PO TABS
0.5000 mg | ORAL_TABLET | ORAL | Status: DC | PRN
  Filled 2018-06-23: qty 1

## 2018-06-23 MED ORDER — PIPERACILLIN-TAZOBACTAM 3.375 G IVPB 30 MIN
3.3750 g | Freq: Once | INTRAVENOUS | Status: AC
  Administered 2018-06-23: 3.375 g via INTRAVENOUS

## 2018-06-23 MED ORDER — GLYCOPYRROLATE 1 MG PO TABS
1.0000 mg | ORAL_TABLET | ORAL | Status: DC | PRN
  Filled 2018-06-23: qty 1

## 2018-06-23 MED ORDER — GLYCOPYRROLATE 0.2 MG/ML IJ SOLN: 0.2000 mg | INTRAMUSCULAR | Status: DC | PRN

## 2018-06-23 MED ORDER — MORPHINE SULFATE (PF) 2 MG/ML IV SOLN
1.0000 mg | INTRAVENOUS | Status: DC | PRN
  Filled 2018-06-23: qty 1

## 2018-06-23 MED ORDER — LORAZEPAM 2 MG/ML PO CONC: 1.0000 mg | ORAL | Status: DC | PRN

## 2018-06-23 MED ORDER — DILTIAZEM HCL-DEXTROSE 100-5 MG/100ML-% IV SOLN (PREMIX)
5.0000 mg/h | INTRAVENOUS | Status: DC
  Administered 2018-06-23: 5 mg/h via INTRAVENOUS
  Filled 2018-06-23: qty 100

## 2018-06-23 MED ORDER — HALOPERIDOL LACTATE 2 MG/ML PO CONC
0.5000 mg | ORAL | Status: DC | PRN
  Filled 2018-06-23: qty 0.3

## 2018-06-23 MED ORDER — DILTIAZEM LOAD VIA INFUSION
20.0000 mg | Freq: Once | INTRAVENOUS | Status: AC
  Administered 2018-06-23: 20 mg via INTRAVENOUS
  Filled 2018-06-23: qty 20

## 2018-06-23 MED ORDER — LORAZEPAM 1 MG PO TABS: 1.0000 mg | ORAL_TABLET | ORAL | Status: DC | PRN

## 2018-06-23 MED ORDER — HALOPERIDOL LACTATE 5 MG/ML IJ SOLN: 0.5000 mg | INTRAMUSCULAR | Status: DC | PRN

## 2018-06-23 MED ORDER — SODIUM CHLORIDE 0.9 % IV BOLUS
1000.0000 mL | Freq: Once | INTRAVENOUS | Status: AC
  Administered 2018-06-23: 1000 mL via INTRAVENOUS

## 2018-06-23 MED ORDER — LORAZEPAM 2 MG/ML IJ SOLN: 1.0000 mg | INTRAMUSCULAR | Status: DC | PRN

## 2018-06-23 MED ORDER — ONDANSETRON 4 MG PO TBDP: 4.0000 mg | ORAL_TABLET | Freq: Four times a day (QID) | ORAL | Status: DC | PRN

## 2018-06-23 MED ORDER — BISACODYL 10 MG RE SUPP: 10.0000 mg | Freq: Every day | RECTAL | Status: DC | PRN

## 2018-06-23 NOTE — ED Notes (Signed)
Niece states she talked to pt approx 1400 yesterday afternoon.

## 2018-06-23 NOTE — ED Notes (Signed)
Bair hugger applied.

## 2018-06-23 NOTE — Plan of Care (Signed)
Patricia [name], great niece: [phone] (cardiac nurse) [name], brother, [phone] [facility])

## 2018-06-23 NOTE — Consult Note (Addendum)
Neurology Consultation  Reason for Consult: Code Stroke Referring Physician: Jacalyn Lefevre, MD  CC: Unresponsiveness  History is obtained from: Family  HPI: Patricia [name] is a 85 y.o. female with hx HTN, CVA who was found down at home and unresponsive by EMS.  Per family, pt had phone conversation at [facility] and went home at around [facility] after some errands.  However, her neighbor who has not seen her in 5 days called for a wellness check.  When EMS arrived, she was found down and unresponsive in urine.  She was noted to have trauma to the R side of her face, R hip, and R lower leg.   ED course: Pt was found to be in A-fib with RVR.  She had a head CT showing large acute MCA stroke with cerebral edema and brain compression.  Stroke Code was cancelled.  LKW: 4PM yesterday tpa given?: no, Large stroke already on CT Premorbid modified Rankin scale (mRS): 0 0-Completely asymptomatic and back to baseline post-stroke 1-No significant post stroke disability and can perform usual duties with stroke symptoms 2-Slight disability-UNABLE to perform all activities but does not need assistance  3-Moderate disability-requires help but walks WITHOUT assistance 4-Needs assistance to walk and tend to bodily needs 5-Severe disability-bedridden, incontinent, needs constant attention 6- Death  ROS: A 14 point ROS was performed and is negative except as noted in the HPI.   Past Medical History:  Diagnosis Date  . Cataract   . HTN (hypertension)   . Palpitations    a. Event monitor 11/2013: rare atrial ectopy, brief atrial runs (longest 1.25seconds), SVE complexes represent 0.82% of total beats, no atrial fibirillation seen, wandering pacemaker, slowest HR 40bpm.  . Renal insufficiency    a. Based on labs available in Epic.  . Stroke [facility])     Family History  Problem Relation Age of Onset  . Diabetes Father   . Diabetes Sister   . Diabetes Brother      Social History:   reports that she has never  smoked. She has never used smokeless tobacco. She reports that she does not drink alcohol or use drugs.  Medications  Current Facility-Administered Medications:  .  [COMPLETED] diltiazem (CARDIZEM) 1 mg/mL load via infusion 20 mg, 20 mg, Intravenous, Once, 20 mg at 06/23/18 1533 **AND** diltiazem (CARDIZEM) 100 mg in dextrose 5% (1 mg/mL) infusion, 5-15 mg/hr, Intravenous, Continuous, Jacalyn Lefevre, MD, Stopped at 06/23/18 1612 .  piperacillin-tazobactam (ZOSYN) IVPB 3.375 g, 3.375 g, Intravenous, Once, Jacalyn Lefevre, MD .  sodium chloride 0.9 % bolus 1,000 mL, 1,000 mL, Intravenous, Once, Jacalyn Lefevre, MD, Last Rate: 983.6 mL/hr at 06/23/18 1634, 1,000 mL at 06/23/18 1634  Current Outpatient Medications:  .  acetaminophen (TYLENOL) 325 MG tablet, Take 650 mg by mouth every 6 (six) hours as needed for pain or fever., Disp: , Rfl:  .  amLODipine (NORVASC) 5 MG tablet, Take 5 mg by mouth daily. , Disp: , Rfl: 0 .  aspirin 81 MG chewable tablet, Chew 81 mg by mouth daily., Disp: , Rfl:  .  atorvastatin (LIPITOR) 20 MG tablet, Take 20 mg by mouth daily., Disp: , Rfl:  .  clopidogrel (PLAVIX) 75 MG tablet, Take 1 tablet (75 mg total) by mouth daily., Disp: , Rfl:  .  docusate sodium (COLACE) 250 MG capsule, Take 1 capsule (250 mg total) by mouth daily., Disp: 10 capsule, Rfl: 0 .  hydrALAZINE (APRESOLINE) 25 MG tablet, Take 1 tablet (25 mg total) by mouth 2 (  two) times daily., Disp: , Rfl:  .  hydrochlorothiazide (HYDRODIURIL) 25 MG tablet, Take 1 tablet by mouth daily., Disp: , Rfl: 1 .  lisinopril (PRINIVIL,ZESTRIL) 20 MG tablet, Take 20 mg by mouth daily., Disp: , Rfl:  .  polyethylene glycol powder (GLYCOLAX/MIRALAX) powder, Take 17 g by mouth 2 (two) times daily. Until daily soft stools, Disp: 250 g, Rfl: 0 .  simvastatin (ZOCOR) 20 MG tablet, Take 1 tablet (20 mg total) by mouth daily at 6 PM., Disp: , Rfl:    Exam: Current vital signs: BP (!) 96/44   Pulse (!) 54   Temp (!)  92.4 F (33.6 C) (Rectal)   Resp (!) 22   SpO2 98%  Vital signs in last 24 hours: Temp:  [92.4 F (33.6 C)] 92.4 F (33.6 C) (01/15 1445) Pulse Rate:  [54-147] 54 (01/15 1615) Resp:  [16-24] 22 (01/15 1615) BP: (81-134)/(44-97) 96/44 (01/15 1610) SpO2:  [98 %] 98 % (01/15 1615)  Physical Exam  Constitutional: Appears well-developed and well-nourished.  Psych: Affect appropriate to situation Eyes: No scleral injection HENT: No OP obstrucion Head: Normocephalic.  Cardiovascular: Normal rate and regular rhythm.  Respiratory: Effort normal, non-labored breathing GI: Soft.  No distension. There is no tenderness.  Skin: WDI  Neuro: Lying in bed with eyes closed, does not open eyes to voice, opens eyes to noxious stim, mute, does not follow commands L gaze deviation, surgical pupils, R orbital bruise and swelling R UE posturing Moves L U and LE spontaneously Withdraws b/l LE to noxious stim  NIHSS 30   Labs CBC    Component Value Date/Time   WBC 11.9 (H) 06/23/2018 1412   RBC 3.10 (L) 06/23/2018 1412   HGB 10.2 (L) 06/23/2018 1444   HCT 30.0 (L) 06/23/2018 1444   PLT 203 06/23/2018 1412   MCV 102.6 (H) 06/23/2018 1412   MCH 31.0 06/23/2018 1412   MCHC 30.2 06/23/2018 1412   RDW 12.4 06/23/2018 1412   LYMPHSABS 1.0 06/23/2018 1412   MONOABS 0.9 06/23/2018 1412   EOSABS 0.0 06/23/2018 1412   BASOSABS 0.0 06/23/2018 1412    CMP     Component Value Date/Time   NA 141 06/23/2018 1444   K 3.5 06/23/2018 1444   CL 111 06/23/2018 1444   CO2 19 (L) 06/23/2018 1412   GLUCOSE 126 (H) 06/23/2018 1444   BUN 39 (H) 06/23/2018 1444   CREATININE 1.10 (H) 06/23/2018 1444   CREATININE 1.22 (H) 01/09/2014 1628   CALCIUM 8.2 (L) 06/23/2018 1412   PROT 5.8 (L) 06/23/2018 1412   ALBUMIN 2.5 (L) 06/23/2018 1412   AST 50 (H) 06/23/2018 1412   ALT 22 06/23/2018 1412   ALKPHOS 65 06/23/2018 1412   BILITOT 1.1 06/23/2018 1412   GFRNONAA 34 (L) 06/23/2018 1412   GFRAA 40 (L)  06/23/2018 1412    Lipid Panel     Component Value Date/Time   CHOL 189 02/18/2015 0715   TRIG 39 02/18/2015 0715   HDL 70 02/18/2015 0715   CHOLHDL 2.7 02/18/2015 0715   VLDL 8 02/18/2015 0715   LDLCALC 111 (H) 02/18/2015 0715     Imaging Head CT reviewed   A/P: 85 yo F with hx HTN, CVA found to be in Afib bibems after found down at home unresponsive found to have large acute Left hemispheric infarct with cerebral edema and brain compression.  Stroke mechanism likely cardioembolic Pt in afib with RVR now on cardizem After extensive conversation with family where it  was explained that pt will continue to deteriorate and would never go back to being independent, the family decided to proceed with comfort care.  Prognosis for meaningful neurological recovery is grim  DNR/DNI  Will sign off  Pt is critically ill with high likelihood for clinical deterioration  Total critical care time spent 

## 2018-06-23 NOTE — ED Notes (Signed)
Lab results given to Nurse [place].

## 2018-06-23 NOTE — ED Notes (Signed)
Per Dr Morrison Old, DC [facility]. Pt to stay Med Surg.

## 2018-06-23 NOTE — ED Triage Notes (Signed)
Pt arrives via EMS from home unresponsive. HR afib RVR rate 120-200. IO LLE given 1L NS en route. Laceration to R temple with edema to R eye. LKW approx 1 week ago. Pt smells of urine on arrival.

## 2018-06-23 NOTE — H&P (Signed)
History and Physical    Patricia [name] HWT:[id number] DOB: 28-Mar-1919 DOA: 06/23/2018  PCP: Clovis Riley, L.August Saucer, MD Consultants:  Neurology Patient coming from: home- lives with  Chief Complaint: AMS  HPI: Patricia [name] is a 85 y.o. female with medical history significant for HTN, prior CVA who presented to the ED today via EMS after being found unresponsive by her family. There was some report that she had not been seen in 5 days but pt's great niece reports that patient was seen this morning and found unresponsive later in the day. She was found down, having had urinary incontinence; noted to have trauma to the R side of her face, R hip and R lower leg.  Of note, niece states pt's apartment is infested with bed bugs.  ED Course: She was disoriented and minimally responsive. She was in AF with RVR (new). She was initiated on a diltiazem drip. Head CT showed large acute L MCA stroke with cerebral edema, brain compression and midline shift. Neurohospitalist was consulted who spoke with family and decision was made to pursue comfort care only.  Review of Systems: As per HPI; otherwise review of systems reviewed and negative.    Past Medical History:  Diagnosis Date  . Cataract   . HTN (hypertension)   . Palpitations    a. Event monitor 11/2013: rare atrial ectopy, brief atrial runs (longest 1.25seconds), SVE complexes represent 0.82% of total beats, no atrial fibirillation seen, wandering pacemaker, slowest HR 40bpm.  . Renal insufficiency    a. Based on labs available in Epic.  . Stroke [facility])     Past Surgical History:  Procedure Laterality Date  . ABDOMINAL HYSTERECTOMY    . TOTAL KNEE ARTHROPLASTY     right    Social History   Socioeconomic History  . Marital status: Single    Spouse name: Not on file  . Number of children: Not on file  . Years of education: Not on file  . Highest education level: Not on file  Occupational History  . Not on file  Social Needs  . Financial  resource strain: Not on file  . Food insecurity:    Worry: Not on file    Inability: Not on file  . Transportation needs:    Medical: Not on file    Non-medical: Not on file  Tobacco Use  . Smoking status: Never Smoker  . Smokeless tobacco: Never Used  Substance and Sexual Activity  . Alcohol use: No  . Drug use: No  . Sexual activity: Not on file  Lifestyle  . Physical activity:    Days per week: Not on file    Minutes per session: Not on file  . Stress: Not on file  Relationships  . Social connections:    Talks on phone: Not on file    Gets together: Not on file    Attends religious service: Not on file    Active member of club or organization: Not on file    Attends meetings of clubs or organizations: Not on file    Relationship status: Not on file  . Intimate partner violence:    Fear of current or ex partner: Not on file    Emotionally abused: Not on file    Physically abused: Not on file    Forced sexual activity: Not on file  Other Topics Concern  . Not on file  Social History Narrative   Lives alone.  No longer drives.  No Known Allergies  Family History  Problem Relation Age of Onset  . Diabetes Father   . Diabetes Sister   . Diabetes Brother     Prior to Admission medications   Medication Sig Start Date End Date Taking? Authorizing Provider  acetaminophen (TYLENOL) 325 MG tablet Take 650 mg by mouth every 6 (six) hours as needed for pain or fever.    [provider]  amLODipine (NORVASC) 5 MG tablet Take 5 mg by mouth daily.  09/20/15   [provider]  aspirin 81 MG chewable tablet Chew 81 mg by mouth daily.    [provider]  atorvastatin (LIPITOR) 20 MG tablet Take 20 mg by mouth daily. 04/05/18   [provider]  clopidogrel (PLAVIX) 75 MG tablet Take 1 tablet (75 mg total) by mouth daily. 02/19/15   Ghimire, Werner LeanShanker M, MD  docusate sodium (COLACE) 250 MG capsule Take 1 capsule (250 mg total) by mouth  daily. 05/25/18   Shaune PollackIsaacs, Cameron, MD  hydrALAZINE (APRESOLINE) 25 MG tablet Take 1 tablet (25 mg total) by mouth 2 (two) times daily. 02/19/15   Ghimire, Werner LeanShanker M, MD  hydrochlorothiazide (HYDRODIURIL) 25 MG tablet Take 25 mg by mouth daily.  09/25/14   [provider]  lisinopril (PRINIVIL,ZESTRIL) 20 MG tablet Take 20 mg by mouth daily.    [provider]  polyethylene glycol powder (GLYCOLAX/MIRALAX) powder Take 17 g by mouth 2 (two) times daily. Until daily soft stools 10/28/14   Everlene Farrieransie, William, PA-C  simvastatin (ZOCOR) 20 MG tablet Take 1 tablet (20 mg total) by mouth daily at 6 PM. Patient not taking: Reported on 06/23/2018 02/19/15   Maretta BeesGhimire, Shanker M, MD    Physical Exam: Vitals:   06/23/18 1815 06/23/18 1830 06/23/18 1915 06/23/18 1945  BP: (!) 156/66 (!) 149/60 138/62 (!) 134/54  Pulse: 61 62 63   Resp: 17 (!) 21 20 16   Temp:      TempSrc:      SpO2: 93% 98% 99%      . General: Elderly frail chronically ill appearing female, lying in bed, not responsive to loud noise and tactile stimulation . Head: R facial contusion with ecchymosis and swelling . Eyes: PERRL . Dry mucous membranes . Neck:  supple, no lymphadenopathy . Cardiovascular:  nL S1, S2, tachy, reg rhythm (pt had converted to sinus rhythm by the time of my exam), no murmur. Marland Kitchen. Respiratory:   CTA bilaterally with no wheezes/rales/rhonchi.  Tachypneic but no obvious resp distress. . Abdomen:  soft, NT, ND, NABS . Back:   grossly normal alignment . Skin:  no rash or lesions seen on limited exam . Musculoskeletal:  R hip and R lower leg with skin abrasions . Lower extremities:  No LE edema.  Limited foot exam with no ulcerations.  2+ distal pulses. . Neurologic:  Unresponsive; withdraws to noxious stimuli only    Radiological Exams on Admission: Ct Head Wo Contrast  Result Date: 06/23/2018 CLINICAL DATA:  Altered level of consciousness. EXAM: CT HEAD WITHOUT CONTRAST TECHNIQUE: Contiguous axial  images were obtained from the base of the skull through the vertex without intravenous contrast. COMPARISON:  CT scan of February 17, 2015. FINDINGS: Brain: There is a large area of low density involving the left frontal and parietal cortex consistent with acute infarction. This results in 12 mm of left-to-right midline shift. Ventricular size is within normal limits. No hemorrhage is noted. No definite mass lesion is noted. Low density is also noted in  left occipital lobe concerning for acute infarction. Vascular: No hyperdense vessel or unexpected calcification. Skull: Normal. Negative for fracture or focal lesion. Sinuses/Orbits: No acute finding. Other: None. IMPRESSION: Large area of low density is seen involving the left frontal, parietal and occipital lobes which results in significant mass effect and approximately 12 mm of left-to-right midline shift. This is consistent with large acute nonhemorrhagic infarction. Electronically Signed   By: Lupita Raider, M.D.   On: 06/23/2018 14:44   Dg Pelvis Portable  Result Date: 06/23/2018 CLINICAL DATA:  Found down.  Unresponsive.  History of stroke. EXAM: PORTABLE PELVIS 1-2 VIEWS COMPARISON:  Pelvic CT 05/25/2018. FINDINGS: 1530 hours. The hips are externally rotated. The mineralization and alignment are normal. No evidence of acute fracture, dislocation or femoral head avascular necrosis. Minimal degenerative changes of the hips and sacroiliac joints for age. Iliofemoral atherosclerosis noted bilaterally. IMPRESSION: No acute osseous findings identified. Evaluation of the hips suboptimal due to positioning. Electronically Signed   By: Carey Bullocks M.D.   On: 06/23/2018 15:40   Dg Chest Portable 1 View  Result Date: 06/23/2018 CLINICAL DATA:  Altered level of consciousness. EXAM: PORTABLE CHEST 1 VIEW COMPARISON:  Radiographs of February 17, 2015. FINDINGS: Stable cardiomediastinal silhouette. Atherosclerosis of thoracic aorta is noted. No pneumothorax  or pleural effusion is noted. Mild right upper and lower lobe opacities are noted concerning for possible pneumonia. Mild left basilar subsegmental atelectasis is noted. Bony thorax is unremarkable. IMPRESSION: Mild right upper and lower lobe opacities are noted concerning for possible pneumonia. Mild left basilar subsegmental atelectasis. Aortic Atherosclerosis (ICD10-I70.0). Electronically Signed   By: Lupita Raider, M.D.   On: 06/23/2018 14:48    EKG: Independently reviewed.   Date/Time:                  Wednesday June 23 2018 16:11:30 EST Ventricular Rate:         50 PR Interval:                   QRS Duration: 81 QT Interval:                 494 QTC Calculation:        451 R Axis:                         72 Text Interpretation:       Sinus rhythm Atrial premature complex Anteroseptal infarct, age indeterminate nsr now.    Labs on Admission: I have personally reviewed the available labs and imaging studies at the time of the admission.  Pertinent labs:  CBC WITH DIFFERENTIAL/PLATELET - Abnormal; Notable for the following components:      Result Value    WBC 11.9 (*)    RBC 3.10 (*)    Hemoglobin 9.6 (*)    HCT 31.8 (*)    MCV 102.6 (*)    Neutro Abs 9.9 (*)    Abs Immature Granulocytes 0.10 (*)    All other components within normal limits  COMPREHENSIVE METABOLIC PANEL - Abnormal; Notable for the following components:   CO2 19 (*)    Glucose, Bld 122 (*)    BUN 34 (*)    Creatinine, Ser 1.30 (*)    Calcium 8.2 (*)    Total Protein 5.8 (*)    Albumin 2.5 (*)    AST 50 (*)    GFR calc non Af Amer 34 (*)  GFR calc Af Amer 40 (*)    All other components within normal limits  CK - Abnormal; Notable for the following components:   Total CK 1,763 (*)    All other components within normal limits  PROTIME-INR - Abnormal; Notable for the following components:   Prothrombin Time 15.3 (*)    All other components within normal limits   I-STAT CHEM 8, ED - Abnormal; Notable for the following components:   BUN 39 (*)    Creatinine, Ser 1.10 (*)    Glucose, Bld 126 (*)    Calcium, Ion 1.01 (*)    Hemoglobin 10.2 (*)    HCT 30.0 (*)    All other components within normal limits  I-STAT CG4 LACTIC ACID, ED - Abnormal; Notable for the following components:   Lactic Acid, Venous 2.56 (*)    All other components within normal limits  CULTURE, BLOOD (ROUTINE X 2)  CULTURE, BLOOD (ROUTINE X 2)  URINE CULTURE  ETHANOL  APTT  URINALYSIS, ROUTINE W REFLEX MICROSCOPIC  RAPID URINE DRUG SCREEN, HOSP PERFORMED  CBG MONITORING, ED    Assessment/Plan Principal Problem:   Acute ischemic stroke (HCC) Active Problems:   Pressure injury of skin   Rhabdomyolysis   Atrial fibrillation with RVR (HCC)   Lactic acidemia   This is a 85 y/o F with h/o HTN, HLD, presenting with massive CVA with resultant brain edema and midline shift. She is unresponsive and has very poor prognosis; family has decided to pursue comfort measures only. She is DNR/DNI. Will order prn meds for pain, anxiety, agitation, secretions. Have asked palliative care to see pt as well as case manager/social work to aid with disposition if she survives the night.    DVT prophylaxis: none Code Status: DNR/DNI comfort care - confirmed with patient/family Family Communication: great niece and brother at bedside (see plan of care note for their contact info); brother is NOK  Disposition Plan: Pending Consults called: Neurology, Palliative care, CM/SW  Admission status: Admit - It is my clinical opinion that admission to INPATIENT is reasonable and necessary because of the expectation that this patient will require hospital care that crosses at least 2 midnights to treat this condition based on the medical complexity of the problems presented.  Given the aforementioned information, the predictability of an adverse outcome is felt to be significant.      Elyse Hsu MD [facility]  If note is complete, please contact covering daytime or nighttime physician. [email or web address] Password [facility]  06/23/2018, 9:09 PM

## 2018-06-23 NOTE — ED Provider Notes (Signed)
[facility] EMERGENCY DEPARTMENT Provider Note   CSN: [id number] Arrival date & time: 06/23/18  1408     History   Chief Complaint Chief Complaint  Patient presents with  . Altered Mental Status    HPI Patricia [name] is a 85 y.o. female.  Pt presents to the ED today unresponsive.  The pt lives alone and has not been seen by her neighbors in 5 days.  When EMS arrived, pt was unresponsive on the floor.  She smelled strongly of urine.  EMS started an IO and gave her 2L of NS en route.  She has trauma to the right side of her face, right hip, and right lower leg.  PT is in afib which is new according to chart.     Past Medical History:  Diagnosis Date  . Cataract   . HTN (hypertension)   . Palpitations    a. Event monitor 11/2013: rare atrial ectopy, brief atrial runs (longest 1.25seconds), SVE complexes represent 0.82% of total beats, no atrial fibirillation seen, wandering pacemaker, slowest HR 40bpm.  . Renal insufficiency    a. Based on labs available in Epic.  . Stroke [facility])     Patient Active Problem List   Diagnosis Date Noted  . Essential hypertension 04/24/2015  . Cognitive impairment 04/23/2015  . HLD (hyperlipidemia)   . Carotid stenosis   . Acute ischemic stroke (HCC) 02/18/2015  . TIA (transient ischemic attack) 02/17/2015  . Palpitation 11/15/2013  . HTN (hypertension) 11/15/2013    Past Surgical History:  Procedure Laterality Date  . ABDOMINAL HYSTERECTOMY    . TOTAL KNEE ARTHROPLASTY     right     OB History   No obstetric history on file.      Home Medications    Prior to Admission medications   Medication Sig Start Date End Date Taking? Authorizing Provider  acetaminophen (TYLENOL) 325 MG tablet Take 650 mg by mouth every 6 (six) hours as needed for pain or fever.    [provider]  amLODipine (NORVASC) 5 MG tablet Take 5 mg by mouth daily.  09/20/15   [provider]  aspirin 81 MG chewable tablet Chew  81 mg by mouth daily.    [provider]  atorvastatin (LIPITOR) 20 MG tablet Take 20 mg by mouth daily. 04/05/18   [provider]  clopidogrel (PLAVIX) 75 MG tablet Take 1 tablet (75 mg total) by mouth daily. 02/19/15   Ghimire, Werner Lean, MD  docusate sodium (COLACE) 250 MG capsule Take 1 capsule (250 mg total) by mouth daily. 05/25/18   Shaune Pollack, MD  hydrALAZINE (APRESOLINE) 25 MG tablet Take 1 tablet (25 mg total) by mouth 2 (two) times daily. 02/19/15   Ghimire, Werner Lean, MD  hydrochlorothiazide (HYDRODIURIL) 25 MG tablet Take 1 tablet by mouth daily. 09/25/14   [provider]  lisinopril (PRINIVIL,ZESTRIL) 20 MG tablet Take 20 mg by mouth daily.    [provider]  polyethylene glycol powder (GLYCOLAX/MIRALAX) powder Take 17 g by mouth 2 (two) times daily. Until daily soft stools 10/28/14   Everlene Farrier, PA-C  simvastatin (ZOCOR) 20 MG tablet Take 1 tablet (20 mg total) by mouth daily at 6 PM. 02/19/15   Ghimire, Werner Lean, MD    Family History Family History  Problem Relation Age of Onset  . Diabetes Father   . Diabetes Sister   . Diabetes Brother     Social History Social History   Tobacco Use  .  Smoking status: Never Smoker  . Smokeless tobacco: Never Used  Substance Use Topics  . Alcohol use: No  . Drug use: No     Allergies   Patient has no known allergies.   Review of Systems Review of Systems  Unable to perform ROS: Patient unresponsive     Physical Exam Updated Vital Signs BP (!) 96/44   Pulse (!) 54   Temp (!) 92.4 F (33.6 C) (Rectal)   Resp (!) 22   SpO2 98%   Physical Exam Vitals signs and nursing note reviewed.  Constitutional:      General: She is in acute distress.     Appearance: She is ill-appearing.  HENT:     Head:     Comments: Contusion to right side of face with skin breakdown.  Swelling to right side of face.    Right Ear: External ear normal.     Left Ear: External ear normal.      Nose: Nose normal.     Mouth/Throat:     Mouth: Mucous membranes are dry.  Eyes:     Pupils: Pupils are equal, round, and reactive to light.  Neck:     Comments: Unable to assess Cardiovascular:     Rate and Rhythm: Tachycardia present. Rhythm irregular.  Pulmonary:     Effort: Tachypnea present.     Breath sounds: Normal breath sounds and air entry.  Abdominal:     General: Abdomen is flat. Bowel sounds are normal.     Palpations: Abdomen is soft.  Musculoskeletal:     Comments: Skin tear right hip and RLE  Skin:    General: Skin is dry.     Capillary Refill: Capillary refill takes more than 3 seconds.  Neurological:     Mental Status: She is unresponsive.  Psychiatric:     Comments: Unable to assess      ED Treatments / Results  Labs (all labs ordered are listed, but only abnormal results are displayed) Labs Reviewed  CBC WITH DIFFERENTIAL/PLATELET - Abnormal; Notable for the following components:      Result Value   WBC 11.9 (*)    RBC 3.10 (*)    Hemoglobin 9.6 (*)    HCT 31.8 (*)    MCV 102.6 (*)    Neutro Abs 9.9 (*)    Abs Immature Granulocytes 0.10 (*)    All other components within normal limits  COMPREHENSIVE METABOLIC PANEL - Abnormal; Notable for the following components:   CO2 19 (*)    Glucose, Bld 122 (*)    BUN 34 (*)    Creatinine, Ser 1.30 (*)    Calcium 8.2 (*)    Total Protein 5.8 (*)    Albumin 2.5 (*)    AST 50 (*)    GFR calc non Af Amer 34 (*)    GFR calc Af Amer 40 (*)    All other components within normal limits  CK - Abnormal; Notable for the following components:   Total CK 1,763 (*)    All other components within normal limits  PROTIME-INR - Abnormal; Notable for the following components:   Prothrombin Time 15.3 (*)    All other components within normal limits  I-STAT CHEM 8, ED - Abnormal; Notable for the following components:   BUN 39 (*)    Creatinine, Ser 1.10 (*)    Glucose, Bld 126 (*)    Calcium, Ion 1.01 (*)     Hemoglobin 10.2 (*)  HCT 30.0 (*)    All other components within normal limits  I-STAT CG4 LACTIC ACID, ED - Abnormal; Notable for the following components:   Lactic Acid, Venous 2.56 (*)    All other components within normal limits  CULTURE, BLOOD (ROUTINE X 2)  CULTURE, BLOOD (ROUTINE X 2)  URINE CULTURE  ETHANOL  APTT  URINALYSIS, ROUTINE W REFLEX MICROSCOPIC  RAPID URINE DRUG SCREEN, HOSP PERFORMED  CBG MONITORING, ED    EKG EKG Interpretation  Date/Time:  Wednesday June 23 2018 16:11:30 EST Ventricular Rate:  50 PR Interval:    QRS Duration: 81 QT Interval:  494 QTC Calculation: 451 R Axis:   72 Text Interpretation:  Sinus rhythm Atrial premature complex Anteroseptal infarct, age indeterminate nsr now. Confirmed by Jacalyn Lefevre [phone]) on 06/23/2018 4:15:31 PM   Radiology Ct Head Wo Contrast  Result Date: 06/23/2018 CLINICAL DATA:  Altered level of consciousness. EXAM: CT HEAD WITHOUT CONTRAST TECHNIQUE: Contiguous axial images were obtained from the base of the skull through the vertex without intravenous contrast. COMPARISON:  CT scan of February 17, 2015. FINDINGS: Brain: There is a large area of low density involving the left frontal and parietal cortex consistent with acute infarction. This results in 12 mm of left-to-right midline shift. Ventricular size is within normal limits. No hemorrhage is noted. No definite mass lesion is noted. Low density is also noted in left occipital lobe concerning for acute infarction. Vascular: No hyperdense vessel or unexpected calcification. Skull: Normal. Negative for fracture or focal lesion. Sinuses/Orbits: No acute finding. Other: None. IMPRESSION: Large area of low density is seen involving the left frontal, parietal and occipital lobes which results in significant mass effect and approximately 12 mm of left-to-right midline shift. This is consistent with large acute nonhemorrhagic infarction. Electronically Signed   By: Lupita Raider, M.D.   On: 06/23/2018 14:44   Dg Pelvis Portable  Result Date: 06/23/2018 CLINICAL DATA:  Found down.  Unresponsive.  History of stroke. EXAM: PORTABLE PELVIS 1-2 VIEWS COMPARISON:  Pelvic CT 05/25/2018. FINDINGS: 1530 hours. The hips are externally rotated. The mineralization and alignment are normal. No evidence of acute fracture, dislocation or femoral head avascular necrosis. Minimal degenerative changes of the hips and sacroiliac joints for age. Iliofemoral atherosclerosis noted bilaterally. IMPRESSION: No acute osseous findings identified. Evaluation of the hips suboptimal due to positioning. Electronically Signed   By: Carey Bullocks M.D.   On: 06/23/2018 15:40   Dg Chest Portable 1 View  Result Date: 06/23/2018 CLINICAL DATA:  Altered level of consciousness. EXAM: PORTABLE CHEST 1 VIEW COMPARISON:  Radiographs of February 17, 2015. FINDINGS: Stable cardiomediastinal silhouette. Atherosclerosis of thoracic aorta is noted. No pneumothorax or pleural effusion is noted. Mild right upper and lower lobe opacities are noted concerning for possible pneumonia. Mild left basilar subsegmental atelectasis is noted. Bony thorax is unremarkable. IMPRESSION: Mild right upper and lower lobe opacities are noted concerning for possible pneumonia. Mild left basilar subsegmental atelectasis. Aortic Atherosclerosis (ICD10-I70.0). Electronically Signed   By: Lupita Raider, M.D.   On: 06/23/2018 14:48    Procedures Procedures (including critical care time)  Medications Ordered in ED Medications  diltiazem (CARDIZEM) 1 mg/mL load via infusion 20 mg (20 mg Intravenous Bolus from Bag 06/23/18 1533)    And  diltiazem (CARDIZEM) 100 mg in dextrose 5% (1 mg/mL) infusion (5 mg/hr Intravenous New Bag/Given 06/23/18 1533)  piperacillin-tazobactam (ZOSYN) IVPB 3.375 g (has no administration in time range)  sodium chloride 0.9 %  bolus 1,000 mL (0 mLs Intravenous Stopped 06/23/18 1538)     Initial  Impression / Assessment and Plan / ED Course  I have reviewed the triage vital signs and the nursing notes.  Pertinent labs & imaging results that were available during my care of the patient were reviewed by me and considered in my medical decision making (see chart for details).    Pt's niece said she spoke with pt around 1400 yesterday.  She seemed fine then.  Pt is in new onset atrial fib with RVR.  She was started on cardizem.  This has improved HR.  While here, she did convert to sinus brady.  Cardizem stopped.  Pt given IVFs in ED for BP and for rhabdo.  Pt d/w neurology regarding stroke.  Dr. Scherrie Novembersias is speaking with the family now.  Family has not decided on code status.  No anticoagulation given yet until I hear neurology recommendations.    UA still pending.   Pt treated for aspiration pneumonia with zosyn.  Pt signed out to Dr. Effie ShyWentz pending further studies.  CRITICAL CARE Performed by: Jacalyn LefevreJulie Raekwan Spelman   Total critical care time: 60 minutes  Critical care time was exclusive of separately billable procedures and treating other patients.  Critical care was necessary to treat or prevent imminent or life-threatening deterioration.  Critical care was time spent personally by me on the following activities: development of treatment plan with patient and/or surrogate as well as nursing, discussions with consultants, evaluation of patient's response to treatment, examination of patient, obtaining history from patient or surrogate, ordering and performing treatments and interventions, ordering and review of laboratory studies, ordering and review of radiographic studies, pulse oximetry and re-evaluation of patient's condition.  Final Clinical Impressions(s) / ED Diagnoses   Final diagnoses:  New onset atrial fibrillation (HCC)  Acute CVA (cerebrovascular accident) (HCC)  Traumatic rhabdomyolysis, initial encounter (HCC)  Contusion of face, initial encounter  Skin tear of left  lower leg without complication, initial encounter  Aspiration pneumonia of right lung, unspecified aspiration pneumonia type, unspecified part of lung [facility](HCC)    ED Discharge Orders    None       Jacalyn LefevreHaviland, Shatika Grinnell, MD 06/23/18 1621

## 2018-06-23 NOTE — ED Provider Notes (Signed)
Dr. Scherrie November (Neurohospitalist) has completed discussions with the family and they have elected to make her DNR/DNI/comfort care only.  Will withdraw Cardizem, and initiate hospitalization for comfort care.  4:30 PM-Consult complete with Hospitalist. Patient case explained and discussed. She agrees to admit patient for further evaluation and treatment. Call ended at 5:05 PM   Mancel Bale, MD 06/23/18 1706

## 2018-06-23 NOTE — ED Notes (Signed)
Placed an external urinary device on the patient.  

## 2018-06-23 NOTE — ED Notes (Signed)
Pt's CBG result was 88. Informed Crystal - RN.

## 2018-06-23 NOTE — ED Notes (Signed)
Pt's blood drawn by Crystal - RN.

## 2018-06-23 NOTE — Code Documentation (Signed)
Patient seen and assessed by Dr. Scherrie November and RRT.

## 2018-06-23 NOTE — Progress Notes (Addendum)
Received phone call from Purvis Sheffield who states she is the great niece of the patient and is requesting information over the phone.  I attempted to call back relative at [phone] but got no answer but left message

## 2018-06-24 DIAGNOSIS — Z515 Encounter for palliative care: Secondary | ICD-10-CM

## 2018-06-24 DIAGNOSIS — Z7189 Other specified counseling: Secondary | ICD-10-CM

## 2018-06-24 LAB — CBC
HCT: 29.9 % — ABNORMAL LOW (ref 36.0–46.0)
Hemoglobin: 9.5 g/dL — ABNORMAL LOW (ref 12.0–15.0)
MCH: 31.6 pg (ref 26.0–34.0)
MCHC: 31.8 g/dL (ref 30.0–36.0)
MCV: 99.3 fL (ref 80.0–100.0)
Platelets: 177 10*3/uL (ref 150–400)
RBC: 3.01 MIL/uL — ABNORMAL LOW (ref 3.87–5.11)
RDW: 12.6 % (ref 11.5–15.5)
WBC: 13.6 10*3/uL — ABNORMAL HIGH (ref 4.0–10.5)
nRBC: 0 % (ref 0.0–0.2)

## 2018-06-24 LAB — BASIC METABOLIC PANEL
Anion gap: 12 (ref 5–15)
BUN: 43 mg/dL — ABNORMAL HIGH (ref 8–23)
CO2: 20 mmol/L — ABNORMAL LOW (ref 22–32)
Calcium: 8.4 mg/dL — ABNORMAL LOW (ref 8.9–10.3)
Chloride: 113 mmol/L — ABNORMAL HIGH (ref 98–111)
Creatinine, Ser: 1.59 mg/dL — ABNORMAL HIGH (ref 0.44–1.00)
GFR calc Af Amer: 31 mL/min — ABNORMAL LOW (ref >60)
GFR calc non Af Amer: 27 mL/min — ABNORMAL LOW (ref >60)
Glucose, Bld: 122 mg/dL — ABNORMAL HIGH (ref 70–99)
Potassium: 3.4 mmol/L — ABNORMAL LOW (ref 3.5–5.1)
Sodium: 145 mmol/L (ref 135–145)

## 2018-06-24 MED ORDER — CHLORHEXIDINE GLUCONATE 0.12 % MT SOLN
15.0000 mL | Freq: Two times a day (BID) | OROMUCOSAL | Status: DC
  Administered 2018-06-24: 15 mL via OROMUCOSAL
  Filled 2018-06-24: qty 15

## 2018-06-24 MED ORDER — ORAL CARE MOUTH RINSE
15.0000 mL | Freq: Two times a day (BID) | OROMUCOSAL | Status: DC
  Administered 2018-06-24: 15 mL via OROMUCOSAL

## 2018-06-24 NOTE — Consult Note (Signed)
Consultation Note Date: 06/24/2018   Patient Name: Patricia [name]  DOB: August 28, 1918  MRN: [id number]  Age / Sex: 85 y.o., female  PCP: Clovis Riley, L.August Saucer, MD Referring Physician: Darlin Drop, DO  Reason for Consultation: Establishing goals of care  HPI/Patient Profile: 85 yo F with hx HTN, CVA found to be in Afib bibems after found down at home unresponsive found to have large acute Left hemispheric infarct with cerebral edema and brain compression.  Clinical Assessment and Goals of Care: Patient is resting in bed with eyes closed. She does not respond to verbal stimulus. No family at bedside. She clenches teeth when the nurse attempts to swab her mouth. Per notes, the family has opted for comfort care, and social work consult placed for residential hospice. Comfort care orders in place.  Spoke with brother Eiliana Negroni on the phone to offer support. He states the family understands the magnitude of her stroke and feel she is already gone. He confirms that the family wants to pursue comfort care only as all Ms. Cantor wants to do is go home to be with her Lord. He states the family would be grateful if she could be moved to the hospice home in [facility] as that is where the family lives. If they do not have a bed today, he would be okay with hospice home in [place]. SW made aware of the conversation.     SUMMARY OF RECOMMENDATIONS   GOC already in place per primary team conversation.  Comfort care medications ordered per primary team. She appears comfortable at this time; no changes to regimen made.  Family would like [place] and then [place] hospice home placement.    Code Status/Advance Care Planning:  DNR    Symptom Management:   EOL order set in place.   Palliative Prophylaxis:   Eye Care and Oral Care   Prognosis:   < 2 weeks Stroke resulting in 68mm left to right shift.    Discharge Planning: Home with Hospice      Primary Diagnoses: Present on Admission: . Acute ischemic stroke (HCC)   I have reviewed the medical record, interviewed the patient and family, and examined the patient. The following aspects are pertinent.  Past Medical History:  Diagnosis Date  . Cataract   . HTN (hypertension)   . Palpitations    a. Event monitor 11/2013: rare atrial ectopy, brief atrial runs (longest 1.25seconds), SVE complexes represent 0.82% of total beats, no atrial fibirillation seen, wandering pacemaker, slowest HR 40bpm.  . Renal insufficiency    a. Based on labs available in Epic.  . Stroke [facility])    Social History   Socioeconomic History  . Marital status: Single    Spouse name: Not on file  . Number of children: Not on file  . Years of education: Not on file  . Highest education level: Not on file  Occupational History  . Not on file  Social Needs  . Financial resource strain: Not on file  . Food insecurity:  Worry: Not on file    Inability: Not on file  . Transportation needs:    Medical: Not on file    Non-medical: Not on file  Tobacco Use  . Smoking status: Never Smoker  . Smokeless tobacco: Never Used  Substance and Sexual Activity  . Alcohol use: No  . Drug use: No  . Sexual activity: Not on file  Lifestyle  . Physical activity:    Days per week: Not on file    Minutes per session: Not on file  . Stress: Not on file  Relationships  . Social connections:    Talks on phone: Not on file    Gets together: Not on file    Attends religious service: Not on file    Active member of club or organization: Not on file    Attends meetings of clubs or organizations: Not on file    Relationship status: Not on file  Other Topics Concern  . Not on file  Social History Narrative   Lives alone.  No longer drives.         Family History  Problem Relation Age of Onset  . Diabetes Father   . Diabetes Sister   . Diabetes Brother     Scheduled Meds: . chlorhexidine  15 mL Mouth Rinse BID  . mouth rinse  15 mL Mouth Rinse q12n4p   Continuous Infusions: PRN Meds:.glycopyrrolate **OR** glycopyrrolate **OR** glycopyrrolate, haloperidol **OR** haloperidol **OR** haloperidol lactate, LORazepam **OR** LORazepam **OR** LORazepam, morphine injection, ondansetron **OR** ondansetron (ZOFRAN) IV Medications Prior to Admission:  Prior to Admission medications   Medication Sig Start Date End Date Taking? Authorizing Provider  acetaminophen (TYLENOL) 325 MG tablet Take 650 mg by mouth every 6 (six) hours as needed for pain or fever.    [provider]  amLODipine (NORVASC) 5 MG tablet Take 5 mg by mouth daily.  09/20/15   [provider]  aspirin 81 MG chewable tablet Chew 81 mg by mouth daily.    [provider]  atorvastatin (LIPITOR) 20 MG tablet Take 20 mg by mouth daily. 04/05/18   [provider]  clopidogrel (PLAVIX) 75 MG tablet Take 1 tablet (75 mg total) by mouth daily. 02/19/15   Ghimire, Werner LeanShanker M, MD  docusate sodium (COLACE) 250 MG capsule Take 1 capsule (250 mg total) by mouth daily. 05/25/18   Shaune PollackIsaacs, Cameron, MD  hydrALAZINE (APRESOLINE) 25 MG tablet Take 1 tablet (25 mg total) by mouth 2 (two) times daily. 02/19/15   Ghimire, Werner LeanShanker M, MD  hydrochlorothiazide (HYDRODIURIL) 25 MG tablet Take 25 mg by mouth daily.  09/25/14   [provider]  lisinopril (PRINIVIL,ZESTRIL) 20 MG tablet Take 20 mg by mouth daily.    [provider]  polyethylene glycol powder (GLYCOLAX/MIRALAX) powder Take 17 g by mouth 2 (two) times daily. Until daily soft stools 10/28/14   Everlene Farrieransie, William, PA-C  simvastatin (ZOCOR) 20 MG tablet Take 1 tablet (20 mg total) by mouth daily at 6 PM. Patient not taking: Reported on 06/23/2018 02/19/15   Maretta BeesGhimire, Shanker M, MD   No Known Allergies Review of Systems  Unable to perform ROS   Physical Exam Constitutional:      Appearance: Normal appearance.   Pulmonary:     Effort: Pulmonary effort is normal.  Skin:    Comments: Bandage to right face.   Neurological:     Comments: Eyes closed. Clenches teeth with attempting to swab mouth.      Vital Signs: BP Marland Kitchen(!)  184/60 (BP Location: Right Arm)   Pulse (!) 57   Temp 97.9 F (36.6 C) (Oral)   Resp 18   Wt 59.4 kg   SpO2 98%   BMI 20.51 kg/m  Pain Scale: CPOT       SpO2: SpO2: 98 % O2 Device:SpO2: 98 % O2 Flow Rate: .O2 Flow Rate (L/min): 2 L/min  IO: Intake/output summary:   Intake/Output Summary (Last 24 hours) at 06/24/2018 1140 Last data filed at 06/24/2018 0934 Gross per 24 hour  Intake 80 ml  Output 150 ml  Net -70 ml    LBM:   Baseline Weight: Weight: 59.4 kg Most recent weight: Weight: 59.4 kg     Palliative Assessment/Data: 10%   Flowsheet Rows     Most Recent Value  Intake Tab  Referral Department  Hospitalist  Unit at Time of Referral  ER  Palliative Care Primary Diagnosis  Neurology  Date Notified  06/24/18  Palliative Care Type  New Palliative care  Reason for referral  Clarify Goals of Care  Date of Admission  06/23/18  # of days IP prior to Palliative referral  1  Clinical Assessment  Psychosocial & Spiritual Assessment  Palliative Care Outcomes      Time In: 11:15 Time Out: 11:55 Time Total: 40 min Greater than 50%  of this time was spent counseling and coordinating care related to the above assessment and plan.  Signed by: Morton Stall, NP   Please contact Palliative Medicine Team phone at [phone] for questions and concerns.  For individual provider: See Loretha Stapler

## 2018-06-24 NOTE — Progress Notes (Signed)
Nutrition Brief Note  Pt identified by low braden score. Chart reviewed. Pt is comfort measures only.  No nutrition interventions warranted at this time.  Please consult as needed.   Roslyn SmilingStephanie Keshawn Fiorito, MS, RD, LDN Pager # [phone] After hours/ weekend pager # [phone]

## 2018-06-24 NOTE — Discharge Summary (Signed)
Discharge Summary  Patricia [name] ZOX:[id number] DOB: [birth year]  PCP: Patricia [name], L.August Saucer, MD  Admit date: 06/23/2018 Discharge date: 06/24/2018  Time spent: 35 minutes   Recommendations for Outpatient Follow-up:  1. Follow up with hospice care 2. Hospice care will cover pain and anxiety management  Discharge Diagnoses:  Active Hospital Problems   Diagnosis Date Noted  . Acute ischemic stroke (HCC) 02/18/2015  . Pressure injury of skin 06/23/2018  . Rhabdomyolysis 06/23/2018  . Atrial fibrillation with RVR (HCC) 06/23/2018  . Lactic acidemia 06/23/2018    Resolved Hospital Problems  No resolved problems to display.    Vitals:   06/23/18 2115 06/24/18 0740  BP: (!) 141/59 (!) 184/60  Pulse: 61 (!) 57  Resp: 18 18  Temp: 98 F (36.7 C) 97.9 F (36.6 C)  SpO2: 98%     History of present illness:  Patricia Baldwinis a 85 y.o.femalewith medical history significant forHTN, prior CVAwho presented to the ED today via EMS after being found unresponsive by her family. There was some report that she had not been seen in 5 days but pt's great niece reports that patient was seen this morning and found unresponsive later in the day. She was found down, having had urinary incontinence; noted to have trauma to the R side of her face, R hip and R lower leg.  Of note, niece states pt's apartment is infested with bed bugs.  She was disoriented and minimally responsive. She was in AF with RVR (new). She was initiated on a diltiazem drip. Head CT showed large acute L MCA stroke with cerebral edema, brain compression and midline shift. Neurohospitalist was consulted who spoke with family and decision was made to pursue comfort care only.  06/24/2018: Patient seen and examined at her bedside.  She is unresponsive and does not follow any commands.  Updated her brother and grandniece on the phone, they confirmed comfort care only.  Hospital Course:  Principal Problem:   Acute ischemic stroke  [facility]) Active Problems:   Pressure injury of skin   Rhabdomyolysis   Atrial fibrillation with RVR (HCC)   Lactic acidemia  This is a 85 y/o F with h/o HTN, HLD, presenting with massive acute left MCA CVA with resultant brain edema and midline shift. She is unresponsive and has very poor prognosis; family has decided to pursue comfort measures only. She is DNR/DNI. Will order prn meds for pain, anxiety, agitation, secretions. Have asked palliative care to see pt as well as case manager/social work to aid with disposition if she survives the night.  Massive acute left MCA CVA with left to right midline shift Prior history of CVA Very poor prognosis.  Family made decision for comfort measures only  New onset A. fib with RVR DC treatment due to comfort measures only   DVT prophylaxis: Comfort care Code Status:DNR/DNI comfort care-confirmed with her brother and her grand niece on the phone on 06/24/2018  Family Communication: Updated grand niece and brother on the phone on 06/24/2018.  They confirm comfort measures only.  Disposition Plan: Possibly residential hospice Consults called:Neurology, Palliative care, CM/SW   Discharge Exam: BP (!) 184/60 (BP Location: Right Arm)   Pulse (!) 57   Temp 97.9 F (36.6 C) (Oral)   Resp 18   Wt 59.4 kg   SpO2 98%   BMI 20.51 kg/m  . General: 85 y.o. year-old female well developed well nourished in no acute distress. Unresponsive. Does not follow commands. . Cardiovascular: Regular rate and rhythm with  no rubs or gallops.  No thyromegaly or JVD noted.   Marland Kitchen. Respiratory: Clear to auscultation with no wheezes or rales. Good inspiratory effort. . Abdomen: Soft nontender nondistended with normal bowel sounds x4 quadrants.  Discharge Instructions You were cared for by a hospitalist during your hospital stay. If you have any questions about your discharge medications or the care you received while you were in the hospital after you are  discharged, you can call the unit and asked to speak with the hospitalist on call if the hospitalist that took care of you is not available. Once you are discharged, your primary care physician will handle any further medical issues. Please note that NO REFILLS for any discharge medications will be authorized once you are discharged, as it is imperative that you return to your primary care physician (or establish a relationship with a primary care physician if you do not have one) for your aftercare needs so that they can reassess your need for medications and monitor your lab values.   Allergies as of 06/24/2018   No Known Allergies     Medication List    STOP taking these medications   acetaminophen 325 MG tablet Commonly known as:  TYLENOL   amLODipine 5 MG tablet Commonly known as:  NORVASC   aspirin 81 MG chewable tablet   atorvastatin 20 MG tablet Commonly known as:  LIPITOR   clopidogrel 75 MG tablet Commonly known as:  PLAVIX   docusate sodium 250 MG capsule Commonly known as:  COLACE   hydrALAZINE 25 MG tablet Commonly known as:  APRESOLINE   hydrochlorothiazide 25 MG tablet Commonly known as:  HYDRODIURIL   lisinopril 20 MG tablet Commonly known as:  PRINIVIL,ZESTRIL   polyethylene glycol powder powder Commonly known as:  GLYCOLAX/MIRALAX   simvastatin 20 MG tablet Commonly known as:  ZOCOR      No Known Allergies    The results of significant diagnostics from this hospitalization (including imaging, microbiology, ancillary and laboratory) are listed below for reference.    Significant Diagnostic Studies: Ct Head Wo Contrast  Result Date: 06/23/2018 CLINICAL DATA:  Altered level of consciousness. EXAM: CT HEAD WITHOUT CONTRAST TECHNIQUE: Contiguous axial images were obtained from the base of the skull through the vertex without intravenous contrast. COMPARISON:  CT scan of February 17, 2015. FINDINGS: Brain: There is a large area of low density involving  the left frontal and parietal cortex consistent with acute infarction. This results in 12 mm of left-to-right midline shift. Ventricular size is within normal limits. No hemorrhage is noted. No definite mass lesion is noted. Low density is also noted in left occipital lobe concerning for acute infarction. Vascular: No hyperdense vessel or unexpected calcification. Skull: Normal. Negative for fracture or focal lesion. Sinuses/Orbits: No acute finding. Other: None. IMPRESSION: Large area of low density is seen involving the left frontal, parietal and occipital lobes which results in significant mass effect and approximately 12 mm of left-to-right midline shift. This is consistent with large acute nonhemorrhagic infarction. Electronically Signed   By: Lupita RaiderJames  Green Jr, M.D.   On: 06/23/2018 14:44   Dg Pelvis Portable  Result Date: 06/23/2018 CLINICAL DATA:  Found down.  Unresponsive.  History of stroke. EXAM: PORTABLE PELVIS 1-2 VIEWS COMPARISON:  Pelvic CT 05/25/2018. FINDINGS: 1530 hours. The hips are externally rotated. The mineralization and alignment are normal. No evidence of acute fracture, dislocation or femoral head avascular necrosis. Minimal degenerative changes of the hips and sacroiliac joints for age. Iliofemoral  atherosclerosis noted bilaterally. IMPRESSION: No acute osseous findings identified. Evaluation of the hips suboptimal due to positioning. Electronically Signed   By: Carey Bullocks M.D.   On: 06/23/2018 15:40   Dg Chest Portable 1 View  Result Date: 06/23/2018 CLINICAL DATA:  Altered level of consciousness. EXAM: PORTABLE CHEST 1 VIEW COMPARISON:  Radiographs of February 17, 2015. FINDINGS: Stable cardiomediastinal silhouette. Atherosclerosis of thoracic aorta is noted. No pneumothorax or pleural effusion is noted. Mild right upper and lower lobe opacities are noted concerning for possible pneumonia. Mild left basilar subsegmental atelectasis is noted. Bony thorax is unremarkable.  IMPRESSION: Mild right upper and lower lobe opacities are noted concerning for possible pneumonia. Mild left basilar subsegmental atelectasis. Aortic Atherosclerosis (ICD10-I70.0). Electronically Signed   By: Lupita Raider, M.D.   On: 06/23/2018 14:48    Microbiology: Recent Results (from the past 240 hour(s))  Culture, blood (routine x 2)     Status: None (Preliminary result)   Collection Time: 06/23/18  9:48 PM  Result Value Ref Range Status   Specimen Description BLOOD RIGHT ANTECUBITAL  Final   Special Requests   Final    BOTTLES DRAWN AEROBIC ONLY Blood Culture results may not be optimal due to an inadequate volume of blood received in culture bottles   Culture   Final    NO GROWTH < 12 HOURS Performed at [facility], [address], [place], [place] [address]    Report Status PENDING  Incomplete  Culture, blood (routine x 2)     Status: None (Preliminary result)   Collection Time: 06/23/18  9:54 PM  Result Value Ref Range Status   Specimen Description BLOOD RIGHT FOREARM  Final   Special Requests   Final    BOTTLES DRAWN AEROBIC ONLY Blood Culture results may not be optimal due to an inadequate volume of blood received in culture bottles   Culture   Final    NO GROWTH < 12 HOURS Performed at [facility], [address] [place], [place] [address]    Report Status PENDING  Incomplete     Labs: Basic Metabolic Panel: Recent Labs  Lab 06/23/18 1412 06/23/18 1444 06/24/18 0556  NA 143 141 145  K 3.5 3.5 3.4*  CL 111 111 113*  CO2 19*  --  20*  GLUCOSE 122* 126* 122*  BUN 34* 39* 43*  CREATININE 1.30* 1.10* 1.59*  CALCIUM 8.2*  --  8.4*   Liver Function Tests: Recent Labs  Lab 06/23/18 1412  AST 50*  ALT 22  ALKPHOS 65  BILITOT 1.1  PROT 5.8*  ALBUMIN 2.5*   No results for input(s): LIPASE, AMYLASE in the last 168 hours. No results for input(s): AMMONIA in the last 168 hours. CBC: Recent Labs  Lab 06/23/18 1412 06/23/18 1444  06/24/18 0556  WBC 11.9*  --  13.6*  NEUTROABS 9.9*  --   --   HGB 9.6* 10.2* 9.5*  HCT 31.8* 30.0* 29.9*  MCV 102.6*  --  99.3  PLT 203  --  177   Cardiac Enzymes: Recent Labs  Lab 06/23/18 1448  CKTOTAL 1,763*   BNP: BNP (last 3 results) No results for input(s): BNP in the last 8760 hours.  ProBNP (last 3 results) No results for input(s): PROBNP in the last 8760 hours.  CBG: Recent Labs  Lab 06/23/18 1418  GLUCAP 88       Signed:  Darlin Drop, MD [facility] 06/24/2018, 2:03 PM

## 2018-06-24 NOTE — Progress Notes (Signed)
Report given to [facility] RN.

## 2018-06-24 NOTE — Progress Notes (Signed)
Pt discharge to [facility] per [facility].  Discharge paper given to transport.

## 2018-06-24 NOTE — Progress Notes (Signed)
Discharge to: [facility] Anticipated discharge date: 06/24/18 Family notified: Yes, by phone Transportation by: [facility]  Report #: [id number]  CSW signing off.  Blenda Nicely LCSW [phone]

## 2018-06-24 NOTE — Progress Notes (Signed)
PROGRESS NOTE  Patricia [name] JME:[id number] DOB: 09/05/1918 DOA: 06/23/2018 PCP: Clovis Riley, L.August Saucer, MD  HPI/Recap of past 24 hours: Patricia [name] is a 85 y.o. female with medical history significant for HTN, prior CVA who presented to the ED today via EMS after being found unresponsive by her family. There was some report that she had not been seen in 5 days but pt's great niece reports that patient was seen this morning and found unresponsive later in the day. She was found down, having had urinary incontinence; noted to have trauma to the R side of her face, R hip and R lower leg.  Of note, niece states pt's apartment is infested with bed bugs.  She was disoriented and minimally responsive. She was in AF with RVR (new). She was initiated on a diltiazem drip. Head CT showed large acute L MCA stroke with cerebral edema, brain compression and midline shift. Neurohospitalist was consulted who spoke with family and decision was made to pursue comfort care only.  06/24/2018: Patient seen and examined at her bedside.  She is unresponsive and does not follow any commands.  Updated her brother and grandniece on the phone, they confirmed comfort care only.  Assessment/Plan: Principal Problem:   Acute ischemic stroke (HCC) Active Problems:   Pressure injury of skin   Rhabdomyolysis   Atrial fibrillation with RVR (HCC)   Lactic acidemia  This is a 85 y/o F with h/o HTN, HLD, presenting with massive acute left MCA CVA with resultant brain edema and midline shift. She is unresponsive and has very poor prognosis; family has decided to pursue comfort measures only. She is DNR/DNI. Will order prn meds for pain, anxiety, agitation, secretions. Have asked palliative care to see pt as well as case manager/social work to aid with disposition if she survives the night.   Massive acute left MCA CVA with left to right midline shift Prior history of CVA Very poor prognosis.  Family made decision for comfort  measures only  New onset A. fib with RVR DC treatment due to comfort measures only   DVT prophylaxis:  Comfort care Code Status: DNR/DNI comfort care - confirmed with her brother and her grand niece on the phone on 06/24/2018  Family Communication: Updated grand niece and brother on the phone on 06/24/2018.  They confirm comfort measures only.  Disposition Plan:  Possibly residential hospice Consults called: Neurology, Palliative care, CM/SW    Objective: Vitals:   06/23/18 1915 06/23/18 1945 06/23/18 2115 06/24/18 0740  BP: 138/62 (!) 134/54 (!) 141/59 (!) 184/60  Pulse: 63  61 (!) 57  Resp: 20 16 18 18   Temp:   98 F (36.7 C) 97.9 F (36.6 C)  TempSrc:   Axillary Oral  SpO2: 99%  98%   Weight:   59.4 kg     Intake/Output Summary (Last 24 hours) at 06/24/2018 2297 Last data filed at 06/24/2018 0446 Gross per 24 hour  Intake 80 ml  Output -  Net 80 ml   Filed Weights   06/23/18 2115  Weight: 59.4 kg    Exam:  . General: 85 y.o. year-old female well developed well nourished in no acute distress.  Unresponsive.  Does not follow commands.   . Cardiovascular: Regular rate and rhythm with no rubs or gallops.  No thyromegaly or JVD noted.   Marland Kitchen Respiratory: Clear to auscultation with no wheezes or rales. Good inspiratory effort. . Abdomen: Soft nontender nondistended with normal bowel sounds x4 quadrants.    Data  Reviewed: CBC: Recent Labs  Lab 06/23/18 1412 06/23/18 1444 06/24/18 0556  WBC 11.9*  --  13.6*  NEUTROABS 9.9*  --   --   HGB 9.6* 10.2* 9.5*  HCT 31.8* 30.0* 29.9*  MCV 102.6*  --  99.3  PLT 203  --  177   Basic Metabolic Panel: Recent Labs  Lab 06/23/18 1412 06/23/18 1444 06/24/18 0556  NA 143 141 145  K 3.5 3.5 3.4*  CL 111 111 113*  CO2 19*  --  20*  GLUCOSE 122* 126* 122*  BUN 34* 39* 43*  CREATININE 1.30* 1.10* 1.59*  CALCIUM 8.2*  --  8.4*   GFR: Estimated Creatinine Clearance: 19 mL/min (A) (by C-G formula based on SCr of 1.59  mg/dL (H)). Liver Function Tests: Recent Labs  Lab 06/23/18 1412  AST 50*  ALT 22  ALKPHOS 65  BILITOT 1.1  PROT 5.8*  ALBUMIN 2.5*   No results for input(s): LIPASE, AMYLASE in the last 168 hours. No results for input(s): AMMONIA in the last 168 hours. Coagulation Profile: Recent Labs  Lab 06/23/18 1448  INR 1.22   Cardiac Enzymes: Recent Labs  Lab 06/23/18 1448  CKTOTAL 1,763*   BNP (last 3 results) No results for input(s): PROBNP in the last 8760 hours. HbA1C: No results for input(s): HGBA1C in the last 72 hours. CBG: Recent Labs  Lab 06/23/18 1418  GLUCAP 88   Lipid Profile: No results for input(s): CHOL, HDL, LDLCALC, TRIG, CHOLHDL, LDLDIRECT in the last 72 hours. Thyroid Function Tests: No results for input(s): TSH, T4TOTAL, FREET4, T3FREE, THYROIDAB in the last 72 hours. Anemia Panel: No results for input(s): VITAMINB12, FOLATE, FERRITIN, TIBC, IRON, RETICCTPCT in the last 72 hours. Urine analysis:    Component Value Date/Time   COLORURINE YELLOW 05/25/2018 1206   APPEARANCEUR CLOUDY (A) 05/25/2018 1206   LABSPEC 1.009 05/25/2018 1206   PHURINE 8.0 05/25/2018 1206   GLUCOSEU NEGATIVE 05/25/2018 1206   HGBUR MODERATE (A) 05/25/2018 1206   BILIRUBINUR NEGATIVE 05/25/2018 1206   KETONESUR NEGATIVE 05/25/2018 1206   PROTEINUR NEGATIVE 05/25/2018 1206   UROBILINOGEN 1.0 02/17/2015 2113   NITRITE NEGATIVE 05/25/2018 1206   LEUKOCYTESUR MODERATE (A) 05/25/2018 1206   Sepsis Labs: @LABRCNTIP (procalcitonin:4,lacticidven:4)  )No results found for this or any previous visit (from the past 240 hour(s)).    Studies: Ct Head Wo Contrast  Result Date: 06/23/2018 CLINICAL DATA:  Altered level of consciousness. EXAM: CT HEAD WITHOUT CONTRAST TECHNIQUE: Contiguous axial images were obtained from the base of the skull through the vertex without intravenous contrast. COMPARISON:  CT scan of February 17, 2015. FINDINGS: Brain: There is a large area of low density  involving the left frontal and parietal cortex consistent with acute infarction. This results in 12 mm of left-to-right midline shift. Ventricular size is within normal limits. No hemorrhage is noted. No definite mass lesion is noted. Low density is also noted in left occipital lobe concerning for acute infarction. Vascular: No hyperdense vessel or unexpected calcification. Skull: Normal. Negative for fracture or focal lesion. Sinuses/Orbits: No acute finding. Other: None. IMPRESSION: Large area of low density is seen involving the left frontal, parietal and occipital lobes which results in significant mass effect and approximately 12 mm of left-to-right midline shift. This is consistent with large acute nonhemorrhagic infarction. Electronically Signed   By: Lupita Raider, M.D.   On: 06/23/2018 14:44   Dg Pelvis Portable  Result Date: 06/23/2018 CLINICAL DATA:  Found down.  Unresponsive.  History  of stroke. EXAM: PORTABLE PELVIS 1-2 VIEWS COMPARISON:  Pelvic CT 05/25/2018. FINDINGS: 1530 hours. The hips are externally rotated. The mineralization and alignment are normal. No evidence of acute fracture, dislocation or femoral head avascular necrosis. Minimal degenerative changes of the hips and sacroiliac joints for age. Iliofemoral atherosclerosis noted bilaterally. IMPRESSION: No acute osseous findings identified. Evaluation of the hips suboptimal due to positioning. Electronically Signed   By: Carey BullocksWilliam  Veazey M.D.   On: 06/23/2018 15:40   Dg Chest Portable 1 View  Result Date: 06/23/2018 CLINICAL DATA:  Altered level of consciousness. EXAM: PORTABLE CHEST 1 VIEW COMPARISON:  Radiographs of February 17, 2015. FINDINGS: Stable cardiomediastinal silhouette. Atherosclerosis of thoracic aorta is noted. No pneumothorax or pleural effusion is noted. Mild right upper and lower lobe opacities are noted concerning for possible pneumonia. Mild left basilar subsegmental atelectasis is noted. Bony thorax is  unremarkable. IMPRESSION: Mild right upper and lower lobe opacities are noted concerning for possible pneumonia. Mild left basilar subsegmental atelectasis. Aortic Atherosclerosis (ICD10-I70.0). Electronically Signed   By: Lupita RaiderJames  Green Jr, M.D.   On: 06/23/2018 14:48    Scheduled Meds:  Continuous Infusions: . sodium chloride 10 mL/hr at 06/23/18 2115  . diltiazem (CARDIZEM) infusion Stopped (06/23/18 1612)     LOS: 1 day     Darlin Droparole N Micha Erck, MD [facility] Pager [phone]  If 7PM-7AM, please contact night-coverage [email or web address] Password [email or web address] 06/24/2018, 9:24 AM

## 2018-06-24 NOTE — Progress Notes (Signed)
[facility]: Patricia [name]  Pt was seen and evaluated for hospice house in [place]. Dr. Volney American with hospice was consulted and she was approved to go. Marisue Ivan SW is aware. Family pt's brother Fayrene Fearing is not able to meet at the hospital to do paperwork for transfer but can meet at the [facility] in [place]. He will be there at 200pm. Therefore pt will be ready from our standpoint to transfer anytime after 230pm if MD in agreement here. Norm Parcel RN [phone]

## 2018-06-24 NOTE — Progress Notes (Signed)
IO to Left knee terminated. Dressing applied.

## 2018-06-28 LAB — CULTURE, BLOOD (ROUTINE X 2)
Culture: NO GROWTH
Culture: NO GROWTH

## 2018-07-10 DEATH — deceased

## 2020-09-10 IMAGING — CT CT ABD-PELV W/ CM
2 of 5 series · 14 of 46 positions shown, 16 images · IV contrast (ISOVUE)
Comparison: Lumbar spine films of 04/16/2007

CLINICAL DATA: Intermittent rectal pain and diarrhea

EXAM:
CT ABDOMEN AND PELVIS WITH CONTRAST
TECHNIQUE: Multidetector CT imaging of the abdomen and pelvis was performed
using the standard protocol following bolus administration of
intravenous contrast.
CONTRAST:  75mL OMNIPAQUE IOHEXOL 300 MG/ML  SOLN

[Series 2: axial st · axial · 0.68mm/px · z∈[-356,-6]mm · 11 of 85 slices shown, 13 images]
[im 8/85  soft-tissue]
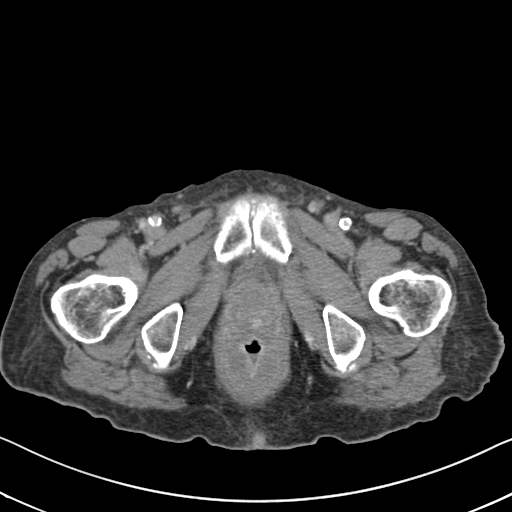
[im 8/85  bone]
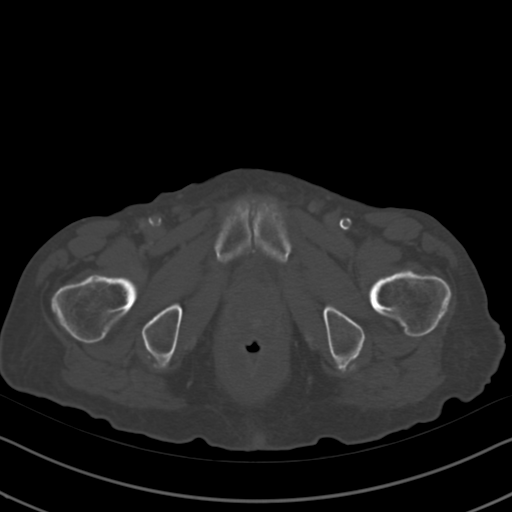
[im 15/85  soft-tissue]
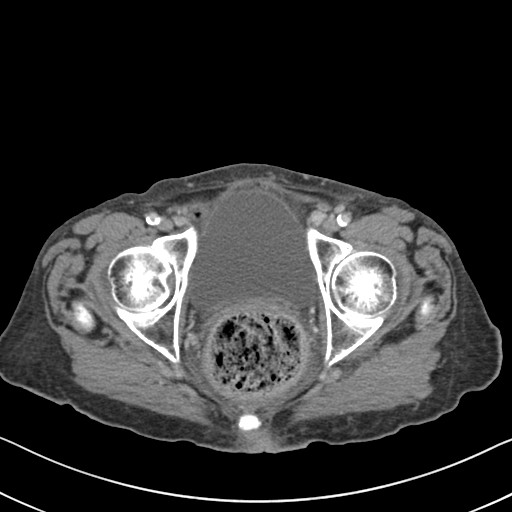
[im 22/85  soft-tissue]
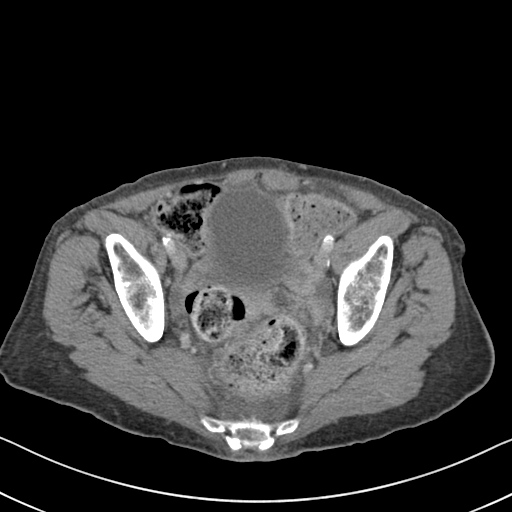
[im 29/85  soft-tissue]
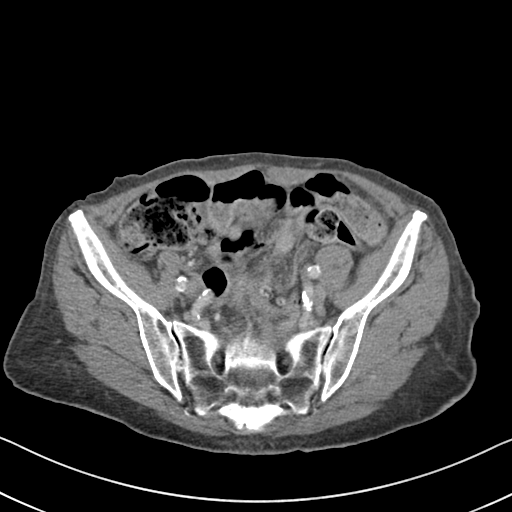
[im 36/85  soft-tissue]
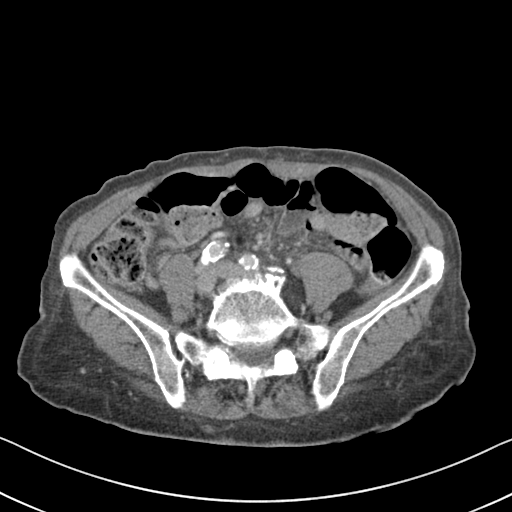
[im 43/85  soft-tissue]
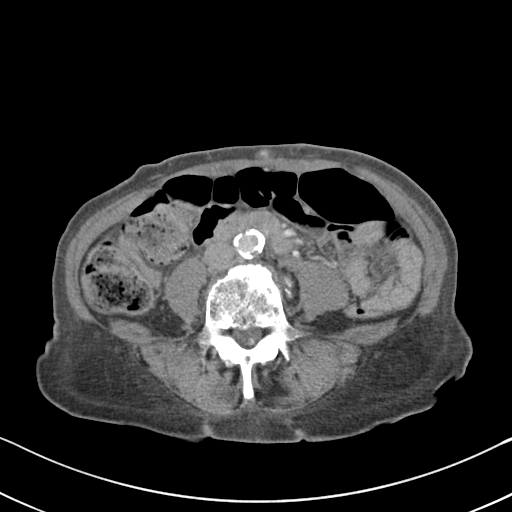
[im 50/85  soft-tissue]
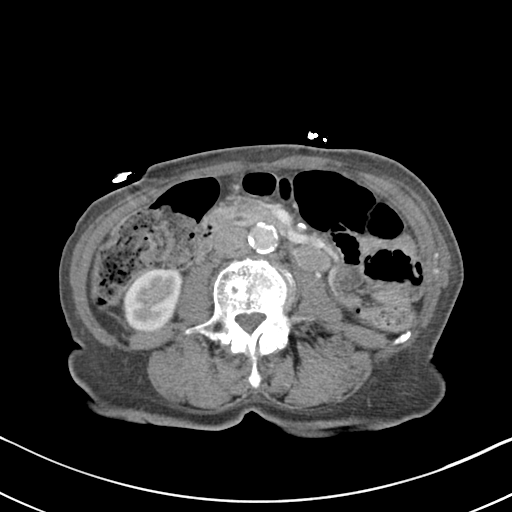
[im 57/85  soft-tissue]
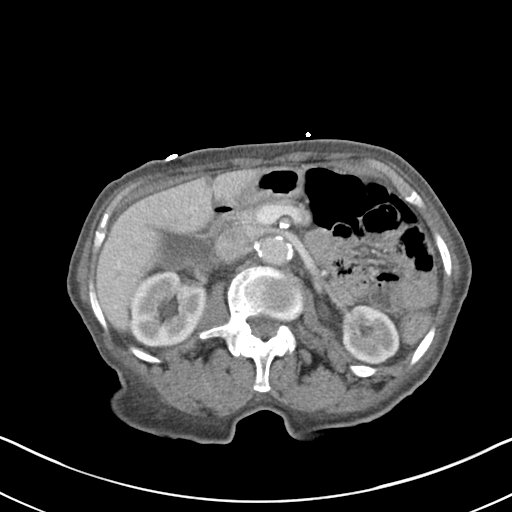
[im 64/85  soft-tissue]
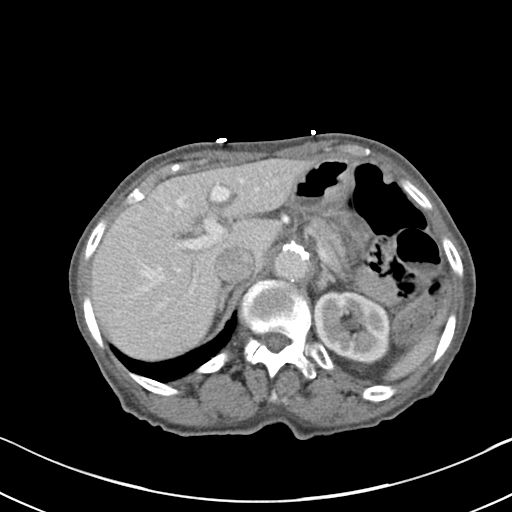
[im 64/85  bone]
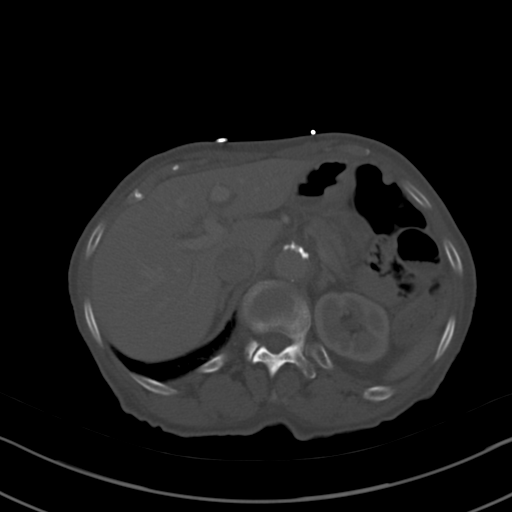
[im 71/85  soft-tissue]
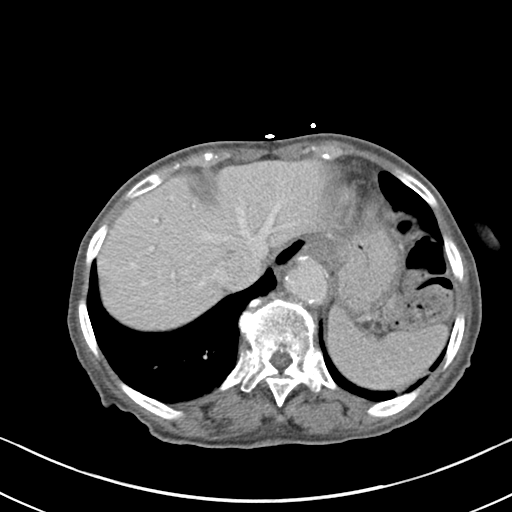
[im 78/85  soft-tissue]
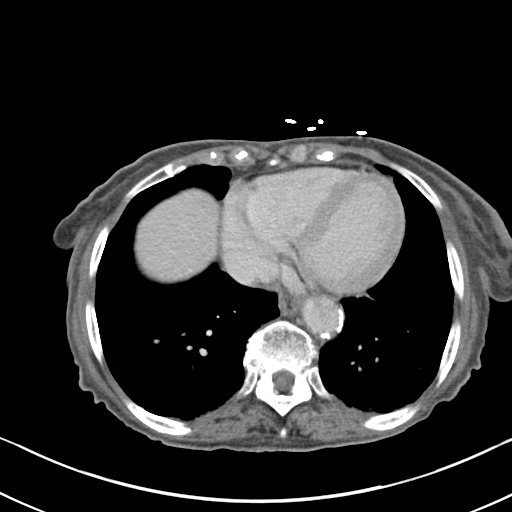

[Series 4: coronal st · coronal · 0.66mm/px · 3 of 77 slices shown]
[im 26/77  soft-tissue]
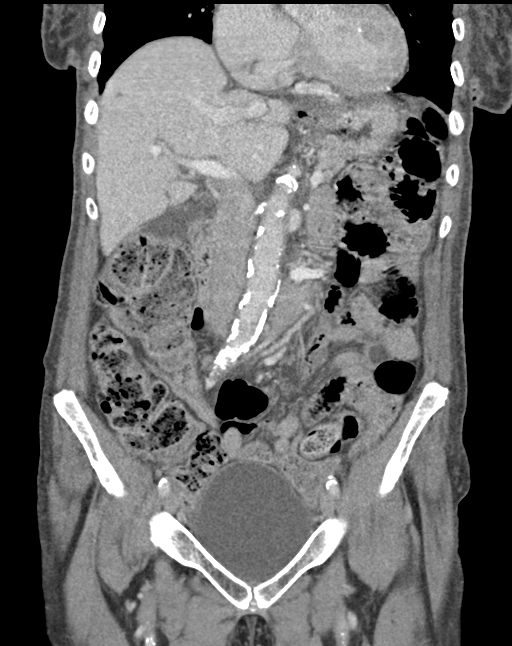
[im 34/77  soft-tissue]
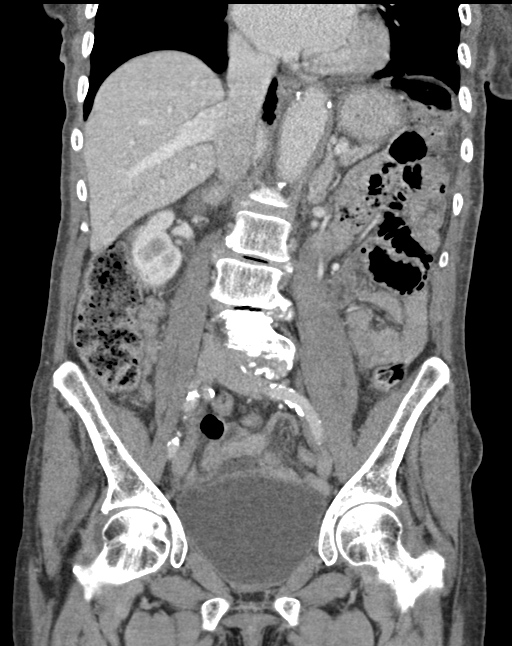
[im 43/77  soft-tissue]
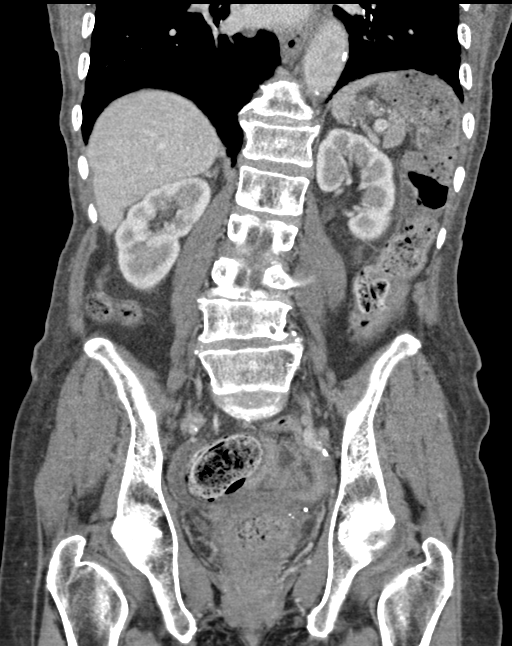

[14 of 46 positions shown; findings below may reference images not displayed]

FINDINGS: Lower chest: Mild bibasilar dependent atelectasis is noted
left-greater-than-right.

Hepatobiliary: The liver enhances and a single subcentimeter
low-attenuation structure is noted near the dome of the right lobe
laterally most consistent with benign process such as cyst or
hemangioma. The gallbladder is visualized and no gallstones are
seen.

Pancreas: The pancreas is normal in size and there is some
prominence of the pancreatic duct probably due to atrophy. No mass
is evident.

Spleen: The spleen is unremarkable.

Adrenals/Urinary Tract: The adrenal glands appear normal. Kidneys
enhance and no renal calculi are seen. There is a 1.9 cm right renal
cyst medially. The renal pelves are slightly prominent but no
obstruction is seen. The ureters appear normal in caliber although
there are difficult to visualize in this patient with very little
fat. The urinary bladder is moderately distended with no abnormality
noted.

Stomach/Bowel: There may be a small hiatal hernia present. The
stomach is completely decompressed and can not be evaluated. No
small bowel distention is seen. The rectosigmoid colon is somewhat
decompressed, but there is a moderately large amount of feces within
the rectum itself. No definite rectal mucosal edema is evident.
There is a moderately large amount of feces throughout the entire
colon. There are scattered diverticula within the rectosigmoid
colon. The terminal ileum is unremarkable. The appendix is not
definitely seen but no inflammatory process is noted.

Vascular/Lymphatic: There is significant abdominal aortic
atherosclerotic change present. No aneurysmal dilatation is seen. No
adenopathy is noted.

Reproductive: The uterus has previously been resected. No adnexal
lesion is seen and no fluid is noted within the pelvis.

Other: No abdominal wall hernia is noted.

Musculoskeletal: There is a lumbar scoliosis present convex to the
right with degenerative change. Anterolisthesis of L3 on L4 is noted
of approximately 8 mm due to degenerative change of the facet
joints. Degenerative disc disease is noticed most marked at L2-3 and
L3-4 levels.
IMPRESSION: 1. Moderately large amount of feces within the rectum with some
distention. No definite rectal mucosal edema is seen. There are
scattered rectosigmoid colon diverticula present and there is feces
throughout the colon as well.
2. No hydronephrosis.  No renal calculi.
3. Significant abdominal aortic atherosclerosis.
4. Anterolisthesis of L3 on L4 by approximately 8 mm due to
degenerative change
